# Patient Record
Sex: Female | Born: 1975 | Race: White | Hispanic: No | Marital: Single | Smoking: Never smoker
Health system: Southern US, Community
[De-identification: ages and names within clinical notes are randomized; demographics above are authoritative.]

## PROBLEM LIST (undated history)

## (undated) DIAGNOSIS — O139 Gestational [pregnancy-induced] hypertension without significant proteinuria, unspecified trimester: Secondary | ICD-10-CM

## (undated) DIAGNOSIS — A4902 Methicillin resistant Staphylococcus aureus infection, unspecified site: Secondary | ICD-10-CM

## (undated) DIAGNOSIS — Z9851 Tubal ligation status: Secondary | ICD-10-CM

## (undated) DIAGNOSIS — K8010 Calculus of gallbladder with chronic cholecystitis without obstruction: Secondary | ICD-10-CM

## (undated) DIAGNOSIS — Z20828 Contact with and (suspected) exposure to other viral communicable diseases: Secondary | ICD-10-CM

## (undated) HISTORY — DX: Gestational (pregnancy-induced) hypertension without significant proteinuria, unspecified trimester: O13.9

## (undated) HISTORY — PX: TUBAL LIGATION: SHX77

---

## 2008-05-26 DIAGNOSIS — A4902 Methicillin resistant Staphylococcus aureus infection, unspecified site: Secondary | ICD-10-CM

## 2008-05-26 HISTORY — DX: Methicillin resistant Staphylococcus aureus infection, unspecified site: A49.02

## 2013-05-20 ENCOUNTER — Encounter (HOSPITAL_COMMUNITY): Admission: EM | Disposition: A | Discharge status: Home / Self Care | Payer: Self-pay | Source: Home / Self Care

## 2013-05-20 ENCOUNTER — Observation Stay (HOSPITAL_COMMUNITY): Payer: Medicaid Other

## 2013-05-20 ENCOUNTER — Emergency Department (HOSPITAL_COMMUNITY): Payer: Medicaid Other

## 2013-05-20 ENCOUNTER — Encounter (HOSPITAL_COMMUNITY): Payer: Medicaid Other | Admitting: Anesthesiology

## 2013-05-20 ENCOUNTER — Encounter (HOSPITAL_COMMUNITY): Payer: Self-pay | Admitting: Emergency Medicine

## 2013-05-20 ENCOUNTER — Observation Stay (HOSPITAL_COMMUNITY): Payer: Medicaid Other | Admitting: Anesthesiology

## 2013-05-20 ENCOUNTER — Observation Stay (HOSPITAL_COMMUNITY)
Admission: EM | Admit: 2013-05-20 | Discharge: 2013-05-21 | Disposition: A | Discharge status: Home / Self Care | Payer: Medicaid Other | Attending: General Surgery | Admitting: General Surgery

## 2013-05-20 DIAGNOSIS — K8020 Calculus of gallbladder without cholecystitis without obstruction: Secondary | ICD-10-CM

## 2013-05-20 DIAGNOSIS — Z6841 Body Mass Index (BMI) 40.0 and over, adult: Secondary | ICD-10-CM | POA: Insufficient documentation

## 2013-05-20 DIAGNOSIS — K8010 Calculus of gallbladder with chronic cholecystitis without obstruction: Secondary | ICD-10-CM

## 2013-05-20 HISTORY — DX: Calculus of gallbladder with chronic cholecystitis without obstruction: K80.10

## 2013-05-20 HISTORY — PX: LAPAROSCOPIC CHOLECYSTECTOMY W/ CHOLANGIOGRAPHY: SUR757

## 2013-05-20 HISTORY — PX: CHOLECYSTECTOMY: SHX55

## 2013-05-20 HISTORY — DX: Methicillin resistant Staphylococcus aureus infection, unspecified site: A49.02

## 2013-05-20 HISTORY — DX: Tubal ligation status: Z98.51

## 2013-05-20 HISTORY — DX: Contact with and (suspected) exposure to other viral communicable diseases: Z20.828

## 2013-05-20 LAB — CBC WITH DIFFERENTIAL/PLATELET
Basophils Relative: 0 % (ref 0–1)
Eosinophils Absolute: 0.1 10*3/uL (ref 0.0–0.7)
Eosinophils Relative: 1 % (ref 0–5)
Hemoglobin: 12.9 g/dL (ref 12.0–15.0)
MCH: 29.9 pg (ref 26.0–34.0)
MCHC: 33.9 g/dL (ref 30.0–36.0)
MCV: 88.4 fL (ref 78.0–100.0)
Monocytes Absolute: 0.8 10*3/uL (ref 0.1–1.0)
Monocytes Relative: 7 % (ref 3–12)
Neutrophils Relative %: 66 % (ref 43–77)
RBC: 4.31 MIL/uL (ref 3.87–5.11)
RDW: 13.4 % (ref 11.5–15.5)
WBC: 11.4 10*3/uL — ABNORMAL HIGH (ref 4.0–10.5)

## 2013-05-20 LAB — COMPREHENSIVE METABOLIC PANEL
ALT: 16 U/L (ref 0–35)
Alkaline Phosphatase: 67 U/L (ref 39–117)
BUN: 16 mg/dL (ref 6–23)
CO2: 22 mEq/L (ref 19–32)
Calcium: 9.1 mg/dL (ref 8.4–10.5)
GFR calc Af Amer: 83 mL/min — ABNORMAL LOW (ref >90)
Glucose, Bld: 103 mg/dL — ABNORMAL HIGH (ref 70–99)
Potassium: 3.4 mEq/L — ABNORMAL LOW (ref 3.5–5.1)
Sodium: 136 mEq/L (ref 135–145)
Total Protein: 7.2 g/dL (ref 6.0–8.3)

## 2013-05-20 LAB — URINALYSIS, ROUTINE W REFLEX MICROSCOPIC
Bilirubin Urine: NEGATIVE
Ketones, ur: NEGATIVE mg/dL
Nitrite: NEGATIVE
Protein, ur: NEGATIVE mg/dL
Specific Gravity, Urine: 1.027 (ref 1.005–1.030)
Urobilinogen, UA: 0.2 mg/dL (ref 0.0–1.0)

## 2013-05-20 LAB — URINE MICROSCOPIC-ADD ON

## 2013-05-20 LAB — PREGNANCY, URINE: Preg Test, Ur: NEGATIVE

## 2013-05-20 SURGERY — LAPAROSCOPIC CHOLECYSTECTOMY WITH INTRAOPERATIVE CHOLANGIOGRAM
Anesthesia: General | Site: Abdomen

## 2013-05-20 MED ORDER — DIPHENHYDRAMINE HCL 12.5 MG/5ML PO ELIX: 12.5000 mg | ORAL_SOLUTION | Freq: Four times a day (QID) | ORAL | Status: DC | PRN

## 2013-05-20 MED ORDER — DIPHENHYDRAMINE HCL 50 MG/ML IJ SOLN: 12.5000 mg | Freq: Four times a day (QID) | INTRAMUSCULAR | Status: DC | PRN

## 2013-05-20 MED ORDER — GLYCOPYRROLATE 0.2 MG/ML IJ SOLN
INTRAMUSCULAR | Status: DC | PRN
  Administered 2013-05-20: .8 mg via INTRAVENOUS

## 2013-05-20 MED ORDER — LACTATED RINGERS IV SOLN
INTRAVENOUS | Status: DC | PRN
  Administered 2013-05-20: 11:00:00 via INTRAVENOUS

## 2013-05-20 MED ORDER — PROPOFOL 10 MG/ML IV BOLUS
INTRAVENOUS | Status: AC
  Filled 2013-05-20: qty 20

## 2013-05-20 MED ORDER — FENTANYL CITRATE 0.05 MG/ML IJ SOLN
INTRAMUSCULAR | Status: DC | PRN
  Administered 2013-05-20: 100 ug via INTRAVENOUS
  Administered 2013-05-20 (×3): 50 ug via INTRAVENOUS

## 2013-05-20 MED ORDER — LACTATED RINGERS IV SOLN: INTRAVENOUS | Status: DC

## 2013-05-20 MED ORDER — PROMETHAZINE HCL 25 MG/ML IJ SOLN: 6.2500 mg | INTRAMUSCULAR | Status: DC | PRN

## 2013-05-20 MED ORDER — ONDANSETRON HCL 4 MG/2ML IJ SOLN
4.0000 mg | Freq: Once | INTRAMUSCULAR | Status: AC
  Administered 2013-05-20: 4 mg via INTRAVENOUS
  Filled 2013-05-20: qty 2

## 2013-05-20 MED ORDER — FENTANYL CITRATE 0.05 MG/ML IJ SOLN
25.0000 ug | INTRAMUSCULAR | Status: DC | PRN
  Administered 2013-05-20 (×2): 50 ug via INTRAVENOUS

## 2013-05-20 MED ORDER — MIDAZOLAM HCL 2 MG/2ML IJ SOLN
INTRAMUSCULAR | Status: AC
  Filled 2013-05-20: qty 2

## 2013-05-20 MED ORDER — HYDROMORPHONE HCL PF 1 MG/ML IJ SOLN: 0.5000 mg | INTRAMUSCULAR | Status: DC | PRN

## 2013-05-20 MED ORDER — HYDROMORPHONE HCL PF 2 MG/ML IJ SOLN
2.0000 mg | INTRAMUSCULAR | Status: DC | PRN
  Administered 2013-05-20 (×2): 2 mg via INTRAVENOUS
  Filled 2013-05-20 (×2): qty 1

## 2013-05-20 MED ORDER — SODIUM CHLORIDE 0.9 % IV SOLN
INTRAVENOUS | Status: DC
  Administered 2013-05-20: 05:00:00 via INTRAVENOUS

## 2013-05-20 MED ORDER — KCL IN DEXTROSE-NACL 20-5-0.45 MEQ/L-%-% IV SOLN
INTRAVENOUS | Status: DC
  Administered 2013-05-20: 23:00:00 via INTRAVENOUS
  Filled 2013-05-20 (×4): qty 1000

## 2013-05-20 MED ORDER — IOHEXOL 300 MG/ML  SOLN
INTRAMUSCULAR | Status: DC | PRN
  Administered 2013-05-20: 5 mL via INTRAVENOUS

## 2013-05-20 MED ORDER — ONDANSETRON HCL 4 MG/2ML IJ SOLN
4.0000 mg | Freq: Four times a day (QID) | INTRAMUSCULAR | Status: DC | PRN
Start: 2013-05-20 — End: 2013-05-20

## 2013-05-20 MED ORDER — OXYCODONE-ACETAMINOPHEN 5-325 MG PO TABS
1.0000 | ORAL_TABLET | ORAL | Status: DC | PRN
  Administered 2013-05-21 (×2): 2 via ORAL
  Filled 2013-05-20 (×2): qty 2

## 2013-05-20 MED ORDER — ROCURONIUM BROMIDE 100 MG/10ML IV SOLN
INTRAVENOUS | Status: AC
  Filled 2013-05-20: qty 1

## 2013-05-20 MED ORDER — ACETAMINOPHEN 325 MG PO TABS: 650.0000 mg | ORAL_TABLET | Freq: Four times a day (QID) | ORAL | Status: DC | PRN

## 2013-05-20 MED ORDER — POTASSIUM CHLORIDE IN NACL 20-0.9 MEQ/L-% IV SOLN
INTRAVENOUS | Status: DC
  Administered 2013-05-20: 1000 mL via INTRAVENOUS
  Filled 2013-05-20 (×2): qty 1000

## 2013-05-20 MED ORDER — FENTANYL CITRATE 0.05 MG/ML IJ SOLN
INTRAMUSCULAR | Status: AC
  Filled 2013-05-20: qty 5

## 2013-05-20 MED ORDER — PROPOFOL 10 MG/ML IV BOLUS
INTRAVENOUS | Status: DC | PRN
  Administered 2013-05-20: 180 mg via INTRAVENOUS

## 2013-05-20 MED ORDER — INFLUENZA VAC SPLIT QUAD 0.5 ML IM SUSP: 0.5000 mL | INTRAMUSCULAR | Status: DC

## 2013-05-20 MED ORDER — FENTANYL CITRATE 0.05 MG/ML IJ SOLN
100.0000 ug | Freq: Once | INTRAMUSCULAR | Status: AC
  Administered 2013-05-20: 100 ug via INTRAVENOUS
  Filled 2013-05-20: qty 2

## 2013-05-20 MED ORDER — FENTANYL CITRATE 0.05 MG/ML IJ SOLN
INTRAMUSCULAR | Status: AC
  Filled 2013-05-20: qty 2

## 2013-05-20 MED ORDER — DEXAMETHASONE SODIUM PHOSPHATE 10 MG/ML IJ SOLN
INTRAMUSCULAR | Status: AC
  Filled 2013-05-20: qty 1

## 2013-05-20 MED ORDER — GLYCOPYRROLATE 0.2 MG/ML IJ SOLN
INTRAMUSCULAR | Status: AC
  Filled 2013-05-20: qty 4

## 2013-05-20 MED ORDER — ONDANSETRON HCL 4 MG/2ML IJ SOLN
INTRAMUSCULAR | Status: AC
  Filled 2013-05-20: qty 2

## 2013-05-20 MED ORDER — SODIUM CHLORIDE 0.9 % IV SOLN
3.0000 g | Freq: Once | INTRAVENOUS | Status: DC
  Filled 2013-05-20: qty 3

## 2013-05-20 MED ORDER — MIDAZOLAM HCL 5 MG/5ML IJ SOLN
INTRAMUSCULAR | Status: DC | PRN
  Administered 2013-05-20: 2 mg via INTRAVENOUS

## 2013-05-20 MED ORDER — ACETAMINOPHEN 650 MG RE SUPP: 650.0000 mg | Freq: Four times a day (QID) | RECTAL | Status: DC | PRN

## 2013-05-20 MED ORDER — NEOSTIGMINE METHYLSULFATE 1 MG/ML IJ SOLN
INTRAMUSCULAR | Status: DC | PRN
  Administered 2013-05-20: 4.5 mg via INTRAVENOUS

## 2013-05-20 MED ORDER — MEPERIDINE HCL 50 MG/ML IJ SOLN: 6.2500 mg | INTRAMUSCULAR | Status: DC | PRN

## 2013-05-20 MED ORDER — BUPIVACAINE HCL (PF) 0.25 % IJ SOLN
INTRAMUSCULAR | Status: AC
  Filled 2013-05-20: qty 30

## 2013-05-20 MED ORDER — LACTATED RINGERS IR SOLN
Status: DC | PRN
  Administered 2013-05-20: 1000 mL

## 2013-05-20 MED ORDER — BUPIVACAINE HCL (PF) 0.25 % IJ SOLN
INTRAMUSCULAR | Status: DC | PRN
  Administered 2013-05-20: 29 mL

## 2013-05-20 MED ORDER — LIDOCAINE HCL (CARDIAC) 20 MG/ML IV SOLN
INTRAVENOUS | Status: AC
  Filled 2013-05-20: qty 5

## 2013-05-20 MED ORDER — HEPARIN SODIUM (PORCINE) 5000 UNIT/ML IJ SOLN
5000.0000 [IU] | Freq: Three times a day (TID) | INTRAMUSCULAR | Status: DC
  Filled 2013-05-20 (×4): qty 1

## 2013-05-20 MED ORDER — DEXAMETHASONE SODIUM PHOSPHATE 10 MG/ML IJ SOLN
INTRAMUSCULAR | Status: DC | PRN
  Administered 2013-05-20: 10 mg via INTRAVENOUS

## 2013-05-20 MED ORDER — ONDANSETRON HCL 4 MG/2ML IJ SOLN
INTRAMUSCULAR | Status: DC | PRN
  Administered 2013-05-20: 4 mg via INTRAVENOUS

## 2013-05-20 MED ORDER — LIDOCAINE HCL (CARDIAC) 20 MG/ML IV SOLN
INTRAVENOUS | Status: DC | PRN
  Administered 2013-05-20: 50 mg via INTRAVENOUS

## 2013-05-20 MED ORDER — ROCURONIUM BROMIDE 100 MG/10ML IV SOLN
INTRAVENOUS | Status: DC | PRN
  Administered 2013-05-20: 40 mg via INTRAVENOUS

## 2013-05-20 MED ORDER — AMPICILLIN-SULBACTAM SODIUM 3 (2-1) G IJ SOLR
3.0000 g | Freq: Four times a day (QID) | INTRAMUSCULAR | Status: DC
  Administered 2013-05-20: 3 g via INTRAVENOUS
  Filled 2013-05-20 (×2): qty 3

## 2013-05-20 SURGICAL SUPPLY — 33 items
APPLIER CLIP ROT 10 11.4 M/L (STAPLE) ×2
CANISTER SUCTION 2500CC (MISCELLANEOUS) IMPLANT
CHLORAPREP W/TINT 26ML (MISCELLANEOUS) ×2 IMPLANT
CLIP APPLIE ROT 10 11.4 M/L (STAPLE) ×1 IMPLANT
COVER MAYO STAND STRL (DRAPES) ×2 IMPLANT
DECANTER SPIKE VIAL GLASS SM (MISCELLANEOUS) ×2 IMPLANT
DERMABOND ADVANCED (GAUZE/BANDAGES/DRESSINGS) ×1
DERMABOND ADVANCED .7 DNX12 (GAUZE/BANDAGES/DRESSINGS) ×1 IMPLANT
DRAPE C-ARM 42X120 X-RAY (DRAPES) ×2 IMPLANT
DRAPE LAPAROSCOPIC ABDOMINAL (DRAPES) ×2 IMPLANT
DRAPE UTILITY XL STRL (DRAPES) ×2 IMPLANT
ELECT REM PT RETURN 9FT ADLT (ELECTROSURGICAL) ×2
ELECTRODE REM PT RTRN 9FT ADLT (ELECTROSURGICAL) ×1 IMPLANT
FILTER SMOKE EVAC LAPAROSHD (FILTER) IMPLANT
GLOVE EUDERMIC 7 POWDERFREE (GLOVE) ×2 IMPLANT
GOWN STRL REIN XL XLG (GOWN DISPOSABLE) ×8 IMPLANT
HEMOSTAT SURGICEL 4X8 (HEMOSTASIS) IMPLANT
KIT BASIN OR (CUSTOM PROCEDURE TRAY) ×2 IMPLANT
MANIFOLD NEPTUNE II (INSTRUMENTS) ×2 IMPLANT
POUCH SPECIMEN RETRIEVAL 10MM (ENDOMECHANICALS) ×2 IMPLANT
SCISSORS LAP 5X35 DISP (ENDOMECHANICALS) ×2 IMPLANT
SET CHOLANGIOGRAPH MIX (MISCELLANEOUS) ×2 IMPLANT
SET IRRIG TUBING LAPAROSCOPIC (IRRIGATION / IRRIGATOR) ×2 IMPLANT
SLEEVE XCEL OPT CAN 5 100 (ENDOMECHANICALS) ×2 IMPLANT
SOLUTION ANTI FOG 6CC (MISCELLANEOUS) ×2 IMPLANT
SUT MNCRL AB 4-0 PS2 18 (SUTURE) ×2 IMPLANT
TOWEL OR 17X26 10 PK STRL BLUE (TOWEL DISPOSABLE) ×2 IMPLANT
TOWEL OR NON WOVEN STRL DISP B (DISPOSABLE) ×2 IMPLANT
TRAY LAP CHOLE (CUSTOM PROCEDURE TRAY) ×2 IMPLANT
TROCAR BLADELESS OPT 5 100 (ENDOMECHANICALS) ×2 IMPLANT
TROCAR XCEL BLUNT TIP 100MML (ENDOMECHANICALS) ×2 IMPLANT
TROCAR XCEL NON-BLD 11X100MML (ENDOMECHANICALS) ×2 IMPLANT
TUBING INSUFFLATION 10FT LAP (TUBING) ×2 IMPLANT

## 2013-05-20 NOTE — ED Notes (Signed)
Spoke with Wendy Powers in [place] about hanging Unasyn abx.and she stated to hold it.

## 2013-05-20 NOTE — Anesthesia Postprocedure Evaluation (Addendum)
  Anesthesia Post-op Note  Patient: Wendy Powers  Procedure(s) Performed: Procedure(s) (LRB): LAPAROSCOPIC CHOLECYSTECTOMY WITH INTRAOPERATIVE CHOLANGIOGRAM (N/A)  Patient Location: PACU  Anesthesia Type: General  Level of Consciousness: awake and alert   Airway and Oxygen Therapy: Patient Spontanous Breathing  Post-op Pain: mild  Post-op Assessment: Post-op Vital signs reviewed, Patient's Cardiovascular Status Stable, Respiratory Function Stable, Patent Airway and No signs of Nausea or vomiting  Last Vitals:  Filed Vitals:   05/20/13 1739  BP: 106/61  Pulse: 54  Temp: 36.6 C  Resp: 16    Post-op Vital Signs: stable   Complications: No apparent anesthesia complications

## 2013-05-20 NOTE — Transfer of Care (Signed)
Immediate Anesthesia Transfer of Care Note  Patient: Wendy Powers  Procedure(s) Performed: Procedure(s) (LRB): LAPAROSCOPIC CHOLECYSTECTOMY WITH INTRAOPERATIVE CHOLANGIOGRAM (N/A)  Patient Location: PACU  Anesthesia Type: General  Level of Consciousness: sedated, patient cooperative and responds to stimulation  Airway & Oxygen Therapy: Patient Spontanous Breathing and Patient connected to face mask oxgen  Post-op Assessment: Report given to PACU RN and Post -op Vital signs reviewed and stable  Post vital signs: Reviewed and stable  Complications: No apparent anesthesia complications

## 2013-05-20 NOTE — Progress Notes (Signed)
   CARE MANAGEMENT ED NOTE 05/20/2013  Patient:  Wendy Powers, Wendy Powers   Account Number:  [id number]  Date Initiated:  05/20/2013  Documentation initiated by:  Edd Arbour  Subjective/Objective Assessment:   37 yr old self pay female States she recently moved to [place] no insurance coverage nor pcp Confirms she has been to DSS but "did not qualify"  c/o flank pain with known hx of dx of gallstones per pt but without tx     Subjective/Objective Assessment Detail:   imaging shows 1. Multiple gallstones including a nonmobile stone in the neck. No evidence of acute cholecystitis or gross biliary dilatation.  2. Incidental 2 cm liver lesion. Consider MRI for further  evaluation  plans per surgery is  Dr. Jamey Ripa will plan cholecystectomy later today, when her family arrives to care for her children.     Action/Plan:   CM noted no coverage nor pcp with a [place] address & first [facility] visit no admissions Cm spoke with pt see notes below Encouraged pt to seek CM for further questions   Action/Plan Detail:   Anticipated DC Date:  05/21/2013     Status Recommendation to Physician:   Result of Recommendation:    Other ED Services  Consult Working Plan    DC Planning Services  Other  PCP issues  Outpatient Services - Pt will follow up    Choice offered to / List presented to:            Status of service:  Completed, signed off  ED Comments:   ED Comments Detail:  CM spoke with pt who confirms self pay [facility] resident with no pcp. CM discussed and provided written information for self pay pcps, importance of pcp for f/u care, www.needymeds.org, discounted pharmacies and other [place] resources such as financial assistance, DSS and  health department  Reviewed resources for [employer] self pay pcps like [facility], family medicine at [address], [facility] family practice, general medical clinics, [facility] urgent care plus others, [facility] out patient  pharmacies and housing Pt voiced understanding and appreciation of resources provided

## 2013-05-20 NOTE — ED Notes (Signed)
Crackers, peanut butter and apple juice given to pt's children for breakfast per pt's request. Diapers and baby wipes offered, pt sts she still has some and will let us know if she needs any more.

## 2013-05-20 NOTE — H&P (Signed)
Wendy Powers is an 37 y.o. female.   Chief Complaint: Gallstones with RUQ pain and some nausea. HPI: 37 y/o female with hx of cholelithiasis just moved to area.  She has had pain in the past, like this and it lasted about 1 week.  Last episode was in September.  Last PM she has acute onset of pain RUQ and going to back, some nausea no vomiting.  She came to the ER where WBC is mildly elevated, LFT'S and lipase are normal.  Abdominal US  Shows:  1. Multiple gallstones including a nonmobile stone in the neck. No evidence of acute cholecystitis or gross biliary dilatation. evidence of acute cholecystitis or gross biliary dilatation.  2. Incidental 2 cm liver lesion. Consider MRI for further evaluation.  We are ask to see.  She is feeling better now, but still has tenderness in the RUQ going to back.     Past Medical History  Diagnosis Date   No medical issues Body mass index is 43.41 kg/(m^2).      History reviewed. No pertinent past surgical history. Tubal ligation and 2 C sections History reviewed. No pertinent family history. Both parents living, overweight and smokers 2 brothers one with ETOH use  Social History:  reports that she has never smoked. She does not have any smokeless tobacco history on file. She reports that she drinks alcohol. She reports that she does not use illicit drugs.  Allergies: No Known Allergies Prior to Admission medications   None    Results for orders placed during the hospital encounter of 05/20/13 (from the past 48 hour(s))  COMPREHENSIVE METABOLIC PANEL     Status: Abnormal   Collection Time    05/20/13  4:35 AM      Result Value Range   Sodium 136  135 - 145 mEq/L   Potassium 3.4 (*) 3.5 - 5.1 mEq/L   Chloride 101  96 - 112 mEq/L   CO2 22  19 - 32 mEq/L   Glucose, Bld 103 (*) 70 - 99 mg/dL   BUN 16  6 - 23 mg/dL   Creatinine, Ser 4.09  0.50 - 1.10 mg/dL   Calcium 9.1  8.4 - 81.1 mg/dL   Total Protein 7.2  6.0 - 8.3 g/dL   Albumin 3.7  3.5 -  5.2 g/dL   AST 14  0 - 37 U/L   ALT 16  0 - 35 U/L   Alkaline Phosphatase 67  39 - 117 U/L   Total Bilirubin 0.2 (*) 0.3 - 1.2 mg/dL   GFR calc non Af Amer 71 (*) >90 mL/min   GFR calc Af Amer 83 (*) >90 mL/min   Comment: (NOTE)     The eGFR has been calculated using the CKD EPI equation.     This calculation has not been validated in all clinical situations.     eGFR's persistently <90 mL/min signify possible Chronic Kidney     Disease.  LIPASE, BLOOD     Status: None   Collection Time    05/20/13  4:35 AM      Result Value Range   Lipase 22  11 - 59 U/L  CBC WITH DIFFERENTIAL     Status: Abnormal   Collection Time    05/20/13  4:35 AM      Result Value Range   WBC 11.4 (*) 4.0 - 10.5 K/uL   RBC 4.31  3.87 - 5.11 MIL/uL   Hemoglobin 12.9  12.0 - 15.0 g/dL  HCT 38.1  36.0 - 46.0 %   MCV 88.4  78.0 - 100.0 fL   MCH 29.9  26.0 - 34.0 pg   MCHC 33.9  30.0 - 36.0 g/dL   RDW 16.1  09.6 - 04.5 %   Platelets 237  150 - 400 K/uL   Neutrophils Relative % 66  43 - 77 %   Neutro Abs 7.6  1.7 - 7.7 K/uL   Lymphocytes Relative 25  12 - 46 %   Lymphs Abs 2.9  0.7 - 4.0 K/uL   Monocytes Relative 7  3 - 12 %   Monocytes Absolute 0.8  0.1 - 1.0 K/uL   Eosinophils Relative 1  0 - 5 %   Eosinophils Absolute 0.1  0.0 - 0.7 K/uL   Basophils Relative 0  0 - 1 %   Basophils Absolute 0.0  0.0 - 0.1 K/uL  URINALYSIS, ROUTINE W REFLEX MICROSCOPIC     Status: Abnormal   Collection Time    05/20/13  4:37 AM      Result Value Range   Color, Urine YELLOW  YELLOW   APPearance CLEAR  CLEAR   Specific Gravity, Urine 1.027  1.005 - 1.030   pH 5.5  5.0 - 8.0   Glucose, UA NEGATIVE  NEGATIVE mg/dL   Hgb urine dipstick LARGE (*) NEGATIVE   Bilirubin Urine NEGATIVE  NEGATIVE   Ketones, ur NEGATIVE  NEGATIVE mg/dL   Protein, ur NEGATIVE  NEGATIVE mg/dL   Urobilinogen, UA 0.2  0.0 - 1.0 mg/dL   Nitrite NEGATIVE  NEGATIVE   Leukocytes, UA NEGATIVE  NEGATIVE  PREGNANCY, URINE     Status: None    Collection Time    05/20/13  4:37 AM      Result Value Range   Preg Test, Ur NEGATIVE  NEGATIVE   Comment:            THE SENSITIVITY OF THIS     METHODOLOGY IS >20 mIU/mL.  URINE MICROSCOPIC-ADD ON     Status: None   Collection Time    05/20/13  4:37 AM      Result Value Range   Squamous Epithelial / LPF RARE  RARE   WBC, UA 0-2  <3 WBC/hpf   RBC / HPF 21-50  <3 RBC/hpf   US Abdomen Complete  05/20/2013   CLINICAL DATA:  Right upper quadrant pain.  EXAM: ULTRASOUND ABDOMEN COMPLETE  COMPARISON:  None.  FINDINGS: Gallbladder:  Multiple shadowing stones were identified in the gallbladder, including a 2.8 cm nonmobile stone within the neck. The gallbladder wall was not thickened, measuring 2.9 mm. Sonographic Murphy sign was negative.  Common bile duct:  Diameter: 6.2 mm  Liver:  A 2.0 x 1.1 x 1.4 cm hypoechoic lesion was present in the right hepatic lobe.  IVC:  No abnormality visualized.  Pancreas:  Visualized portion unremarkable.  Spleen:  Size and appearance within normal limits.  Right Kidney:  Length: 11.6. Echogenicity within normal limits. No mass or hydronephrosis visualized.  Left Kidney:  Length: 12.1. Echogenicity within normal limits. No mass or hydronephrosis visualized.  Abdominal aorta:  No aneurysm visualized.  Other findings:  None.  IMPRESSION: 1. Multiple gallstones including a nonmobile stone in the neck. No evidence of acute cholecystitis or gross biliary dilatation. 2. Incidental 2 cm liver lesion. Consider MRI for further evaluation.   Electronically Signed   By: Sebastian Ache   On: 05/20/2013 07:21    Review of Systems  Constitutional:  Negative.   HENT: Negative.   Eyes: Negative.   Respiratory: Negative.   Cardiovascular: Negative.   Gastrointestinal: Negative.   Genitourinary: Negative.   Musculoskeletal: Negative.   Skin: Negative.   Neurological: Negative.   Endo/Heme/Allergies: Negative.   Psychiatric/Behavioral: Negative.     Blood pressure 122/55,  pulse 50, temperature 98.6 F (37 C), temperature source Oral, resp. rate 16, height 5\' 4"  (1.626 m), weight 114.76 kg (253 lb), last menstrual period 04/20/2013, SpO2 100.00%. Physical Exam  Constitutional: She is oriented to person, place, and time. She appears well-developed and well-nourished. No distress.  Body mass index is 43.41 kg/(m^2). 43 BP 122/55  Pulse 50  Temp(Src) 98.6 F (37 C) (Oral)  Resp 16  Ht 5\' 4"  (1.626 m)  Wt 114.76 kg (253 lb)  BMI 43.41 kg/m2  SpO2 100%  LMP 04/20/2013  HENT:  Head: Normocephalic and atraumatic.  Nose: Nose normal.  Eyes: Conjunctivae and EOM are normal. Pupils are equal, round, and reactive to light. Right eye exhibits no discharge. Left eye exhibits no discharge. No scleral icterus.  Neck: Normal range of motion. Neck supple. No JVD present. No tracheal deviation present. No thyromegaly present.  Cardiovascular: Normal rate, regular rhythm, normal heart sounds and intact distal pulses.  Exam reveals no gallop.   No murmur heard. Respiratory: Effort normal and breath sounds normal. No respiratory distress. She has no wheezes. She has no rales. She exhibits no tenderness.  GI: Soft. Bowel sounds are normal. She exhibits no distension and no mass. There is tenderness (pain in RUQ and goes to back.). There is no rebound and no guarding.  Morbid obesity  Musculoskeletal: She exhibits no edema and no tenderness.  Lymphadenopathy:    She has no cervical adenopathy.  Neurological: She is alert and oriented to person, place, and time. No cranial nerve deficit.  Skin: Skin is warm and dry. No rash noted. She is not diaphoretic. No erythema. No pallor.  Psychiatric: She has a normal mood and affect. Her behavior is normal. Judgment and thought content normal.     Assessment/Plan: 1.  Cholelithiasis with possible cholecystitis 2.  Body mass index is 43.41 kg/(m^2).   Plan:  Dr. Jamey Ripa will plan cholecystectomy later today, when her family arrives  to care for her children.  Erasmo Vertz 05/20/2013, 8:35 AM

## 2013-05-20 NOTE — Op Note (Signed)
Wendy Powers Sep 02, 1975 [id number] 05/20/2013  Preoperative diagnosis: Early acute calculus cholecystitis  Postoperative diagnosis: Same  Procedure: Laparoscopic cholecystectomy with intraoperative cholangiogram  Surgeon: Currie Paris, MD, FACS  Assistant surgeon: Dr. Violeta Gelinas   Anesthesia: General  Clinical History and Indications: This patient has known gallstones and comes in today To the emergency room because of acute right upper quadrant pain radiating through to the back. Her gallbladder ultrasound showed what appeared to be early thickening of the gallbladder wall and shows slightly elevated white count and was tender in the right upper quadrant. We recommended at this point that she go ahead with laparoscopic cholecystectomy and she agreed.  Description of procedure: The patient was seen in the preoperative area. I reviewed the plans for the procedure with her as well as the risks and complications. She had no further questions and wished to proceed.  The patient was taken to the operating room. After satisfactory general endotracheal anesthesia had been obtained the abdomen was prepped and draped. A time out was done.  0.25% plain Marcaine was used at all incisions. I made an umbilical incision, identified the fascia and opened that, and entered the peritoneal cavity under direct vision. A 0 Vicryl pursestring suture was placed and the Hasson cannula was introduced under direct vision and secured with the pursestring. The abdomen was inflated to 15 cm.  The camera was placed and there were no gross abnormalities. The patient was then placed in reverse Trendelenburg and tilted to the left. A 10/11 trocar was placed in the epigastrium and two 5 mm trochars placed laterally all under direct vision.  The gallbladder was distended and had some edema and early inflammation. There was a stone in the neck of the gallbladder obstructing it.  I was able to open the  peritoneum and identify the cystic duct and cystic artery. I clipped and divided what appeared to be anterior artery. I then put a clip on the cystic duct at the junction of the cystic duct and gallbladder. I had a nice window behind for a safe view.  An intraoperative cholangiogram was then performed. A Cook catheter was introduced percutaneously and placed in the cystic duct. The cholangiogram showed good filling of the common duct and hepatic radicals and free flow into the duodenum. No abnormalities were noted.  The catheter was removed and 3 clips placed on the stay side of the cystic duct. The duct was then divided.  Additional clips are placed on the cystic artery and it was divided. The gallbladder was then removed from below to above the coagulation current of the cautery. It was then placed in a bag to be retrieved later.  The abdomen was irrigated and a check for hemostasis along the bed of the gallbladder made. Once everything appeared to be dry we were able to move the camera to the epigastric port and removed the gallbladder through the umbilical port.  The abdomen was reinsufflated and a final check for hemostasis made. There is no evidence of bleeding or bile leakage. The lateral ports were removed under direct vision and there was no bleeding. The umbilical site was closed with a pursestring, watching with the camera in the epigastric port. The abdomen was then deflated through the epigastric port and that was removed. Skin was closed with 4-0 Monocryl subcuticular and Dermabond.  The patient tolerated the procedure well. There were no operative complications. EBL was minimal. All counts were correct.  Currie Paris, MD, FACS 05/20/2013 12:56 PM

## 2013-05-20 NOTE — ED Notes (Signed)
Pt arrived to the ED with a complaint of flank pain.  Pt states she has been dx with gallstones but has never had anything done about it.  Pt states the pain wouldn't allow her to sleep and has been progressively getting more sever.  Pt states the pain starts underneath her right shoulder blade and then radiates around to her upper abdomen.

## 2013-05-20 NOTE — H&P (Signed)
Agree with A&P of WJ,PA. I think she has early acute cholecystititis, but may just be severe biliary colic. Either way I think she needs cholecystectomy and she would like to proceed today. I have discussed the indications for laparoscopic cholecystectomy with her and provided educational material. We have discussed the risks of surgery, including general risks such as bleeding, infection, lung and heart issues etc. We have also discussed the potential for injuries to other organs, bile duct leaks, and other unexpected events. We have also talked about the fact that this may need to be converted to open under certain circumstances. We discussed the typical post op recovery and the fact that there is a good likelihood of improvement in symptoms and return to normal activity.  She understands this and wishes to proceed to schedule surgery. I believe all of her questions have been answered.

## 2013-05-20 NOTE — ED Provider Notes (Signed)
CSN: [id number]     Arrival date & time 05/20/13  0341 History   First MD Initiated Contact with Patient 05/20/13 [phone]     Chief Complaint  Patient presents with  . Abdominal Pain   (Consider location/radiation/quality/duration/timing/severity/associated sxs/prior Treatment) HPI This is a 37 year old female with known gallstones. She is here with right upper quadrant abdominal pain that began about midnight. It has been constant. It radiates to the right flank and shoulder. She rates it as severe. It is worse with movement or palpation. It has been associated with nausea but no vomiting. She denies diarrhea. She characterizes the pain as like previous episodes of biliary colic but more severe. She has not had surgical followup due to lack of insurance.  Past Medical History  Diagnosis Date  . Hx of tubal ligation    History reviewed. No pertinent past surgical history. History reviewed. No pertinent family history. History  Substance Use Topics  . Smoking status: Never Smoker   . Smokeless tobacco: Not on file  . Alcohol Use: Yes   OB History   Grav Para Term Preterm Abortions TAB SAB Ect Mult Living                 Review of Systems  All other systems reviewed and are negative.    Allergies  Review of patient's allergies indicates no known allergies.  Home Medications  No current outpatient prescriptions on file. BP 122/55  Pulse 50  Temp(Src) 98.6 F (37 C) (Oral)  Resp 16  Ht 5\' 4"  (1.626 m)  Wt 253 lb (114.76 kg)  BMI 43.41 kg/m2  SpO2 100%  LMP 04/20/2013  Physical Exam General: Well-developed, well-nourished female in no acute distress; appearance consistent with age of record HENT: normocephalic; atraumatic Eyes: pupils equal, round and reactive to light; extraocular muscles intact Neck: supple Heart: regular rate and rhythm; no murmurs, rubs or gallops Lungs: clear to auscultation bilaterally Abdomen: soft; nondistended; right upper quadrant  tenderness; no masses or hepatosplenomegaly; bowel sounds present Extremities: No deformity; full range of motion; pulses normal Neurologic: Awake, alert and oriented; motor function intact in all extremities and symmetric; no facial droop Skin: Warm and dry Psychiatric: Normal mood and affect    ED Course  Procedures (including critical care time)  MDM   Nursing notes and vitals signs, including pulse oximetry, reviewed.  Summary of this visit's results, reviewed by myself:  Labs:  Results for orders placed during the hospital encounter of 05/20/13 (from the past 24 hour(s))  COMPREHENSIVE METABOLIC PANEL     Status: Abnormal   Collection Time    05/20/13  4:35 AM      Result Value Range   Sodium 136  135 - 145 mEq/L   Potassium 3.4 (*) 3.5 - 5.1 mEq/L   Chloride 101  96 - 112 mEq/L   CO2 22  19 - 32 mEq/L   Glucose, Bld 103 (*) 70 - 99 mg/dL   BUN 16  6 - 23 mg/dL   Creatinine, Ser 4.40  0.50 - 1.10 mg/dL   Calcium 9.1  8.4 - 10.2 mg/dL   Total Protein 7.2  6.0 - 8.3 g/dL   Albumin 3.7  3.5 - 5.2 g/dL   AST 14  0 - 37 U/L   ALT 16  0 - 35 U/L   Alkaline Phosphatase 67  39 - 117 U/L   Total Bilirubin 0.2 (*) 0.3 - 1.2 mg/dL   GFR calc non Af Amer 71 (*) >  90 mL/min   GFR calc Af Amer 83 (*) >90 mL/min  LIPASE, BLOOD     Status: None   Collection Time    05/20/13  4:35 AM      Result Value Range   Lipase 22  11 - 59 U/L  CBC WITH DIFFERENTIAL     Status: Abnormal   Collection Time    05/20/13  4:35 AM      Result Value Range   WBC 11.4 (*) 4.0 - 10.5 K/uL   RBC 4.31  3.87 - 5.11 MIL/uL   Hemoglobin 12.9  12.0 - 15.0 g/dL   HCT 40.9  81.1 - 91.4 %   MCV 88.4  78.0 - 100.0 fL   MCH 29.9  26.0 - 34.0 pg   MCHC 33.9  30.0 - 36.0 g/dL   RDW 78.2  95.6 - 21.3 %   Platelets 237  150 - 400 K/uL   Neutrophils Relative % 66  43 - 77 %   Neutro Abs 7.6  1.7 - 7.7 K/uL   Lymphocytes Relative 25  12 - 46 %   Lymphs Abs 2.9  0.7 - 4.0 K/uL   Monocytes Relative 7  3 - 12  %   Monocytes Absolute 0.8  0.1 - 1.0 K/uL   Eosinophils Relative 1  0 - 5 %   Eosinophils Absolute 0.1  0.0 - 0.7 K/uL   Basophils Relative 0  0 - 1 %   Basophils Absolute 0.0  0.0 - 0.1 K/uL  URINALYSIS, ROUTINE W REFLEX MICROSCOPIC     Status: Abnormal   Collection Time    05/20/13  4:37 AM      Result Value Range   Color, Urine YELLOW  YELLOW   APPearance CLEAR  CLEAR   Specific Gravity, Urine 1.027  1.005 - 1.030   pH 5.5  5.0 - 8.0   Glucose, UA NEGATIVE  NEGATIVE mg/dL   Hgb urine dipstick LARGE (*) NEGATIVE   Bilirubin Urine NEGATIVE  NEGATIVE   Ketones, ur NEGATIVE  NEGATIVE mg/dL   Protein, ur NEGATIVE  NEGATIVE mg/dL   Urobilinogen, UA 0.2  0.0 - 1.0 mg/dL   Nitrite NEGATIVE  NEGATIVE   Leukocytes, UA NEGATIVE  NEGATIVE  PREGNANCY, URINE     Status: None   Collection Time    05/20/13  4:37 AM      Result Value Range   Preg Test, Ur NEGATIVE  NEGATIVE  URINE MICROSCOPIC-ADD ON     Status: None   Collection Time    05/20/13  4:37 AM      Result Value Range   Squamous Epithelial / LPF RARE  RARE   WBC, UA 0-2  <3 WBC/hpf   RBC / HPF 21-50  <3 RBC/hpf    Imaging Studies: US Abdomen Complete  05/20/2013   CLINICAL DATA:  Right upper quadrant pain.  EXAM: ULTRASOUND ABDOMEN COMPLETE  COMPARISON:  None.  FINDINGS: Gallbladder:  Multiple shadowing stones were identified in the gallbladder, including a 2.8 cm nonmobile stone within the neck. The gallbladder wall was not thickened, measuring 2.9 mm. Sonographic Murphy sign was negative.  Common bile duct:  Diameter: 6.2 mm  Liver:  A 2.0 x 1.1 x 1.4 cm hypoechoic lesion was present in the right hepatic lobe.  IVC:  No abnormality visualized.  Pancreas:  Visualized portion unremarkable.  Spleen:  Size and appearance within normal limits.  Right Kidney:  Length: 11.6. Echogenicity within normal limits. No mass or  hydronephrosis visualized.  Left Kidney:  Length: 12.1. Echogenicity within normal limits. No mass or hydronephrosis  visualized.  Abdominal aorta:  No aneurysm visualized.  Other findings:  None.  IMPRESSION: 1. Multiple gallstones including a nonmobile stone in the neck. No evidence of acute cholecystitis or gross biliary dilatation. 2. Incidental 2 cm liver lesion. Consider MRI for further evaluation.   Electronically Signed   By: Sebastian Ache   On: 05/20/2013 07:21   7:27 AM Pain has improved. Abdomen soft, significantly less right upper quadrant tenderness.  7:49 AM Discussed with Dr. Jamey Ripa. He or a colleague will see in ED.      Hanley Seamen, MD 05/20/13 [phone]

## 2013-05-20 NOTE — ED Notes (Signed)
Pt has her 2 young children at bedside, ages 2 and 43. Pt has no family in the area, she called her mother to come to town to take care of children while she is in surgery. PA Marlyne Beards is aware and states we will wait for pt to arrange childcare before taking her to OR.

## 2013-05-20 NOTE — ED Notes (Signed)
Spoke to pt regarding childcare, pt reports her mother is coming. Pt sts it may take couple of hours until her mother arrives.

## 2013-05-20 NOTE — Anesthesia Preprocedure Evaluation (Signed)
Anesthesia Evaluation  Patient identified by MRN, date of birth, ID band Patient awake    Reviewed: Allergy & Precautions, H&P , NPO status , Patient's Chart, lab work & pertinent test results  Airway Mallampati: II TM Distance: >3 FB Neck ROM: Full    Dental no notable dental hx.    Pulmonary neg pulmonary ROS,  breath sounds clear to auscultation  Pulmonary exam normal       Cardiovascular negative cardio ROS  Rhythm:Regular Rate:Normal     Neuro/Psych negative neurological ROS  negative psych ROS   GI/Hepatic negative GI ROS, Neg liver ROS,   Endo/Other  negative endocrine ROSMorbid obesity  Renal/GU negative Renal ROS  negative genitourinary   Musculoskeletal negative musculoskeletal ROS (+)   Abdominal   Peds negative pediatric ROS (+)  Hematology negative hematology ROS (+)   Anesthesia Other Findings   Reproductive/Obstetrics negative OB ROS                           Anesthesia Physical Anesthesia Plan  ASA: II  Anesthesia Plan: General   Post-op Pain Management:    Induction: Intravenous  Airway Management Planned: Oral ETT  Additional Equipment:   Intra-op Plan:   Post-operative Plan: Extubation in OR  Informed Consent: I have reviewed the patients History and Physical, chart, labs and discussed the procedure including the risks, benefits and alternatives for the proposed anesthesia with the patient or authorized representative who has indicated his/her understanding and acceptance.   Dental advisory given  Plan Discussed with: CRNA  Anesthesia Plan Comments:         Anesthesia Quick Evaluation

## 2013-05-21 MED ORDER — HYDROCODONE-ACETAMINOPHEN 5-325 MG PO TABS: 1.0000 | ORAL_TABLET | Freq: Four times a day (QID) | ORAL | Status: DC | PRN

## 2013-05-21 NOTE — Care Management Utilization Note (Signed)
05/21/13 1031 Galilea Quito,MSN,RN [id number] UR complete

## 2013-05-21 NOTE — Discharge Summary (Signed)
Physician Discharge Summary  Patient ID:  [name]  MRN: [id number]  DOB/AGE: 1975-09-16 37 y.o.  Admit date: 05/20/2013 Discharge date: 05/21/2013  Discharge Diagnoses:   Principal Problem:   Cholelithiasis with cholecystitis  Operation: Procedure(s): LAPAROSCOPIC CHOLECYSTECTOMY WITH INTRAOPERATIVE CHOLANGIOGRAM on 05/20/2013 - C. Streck  Discharged Condition: good  Hospital Course: Wendy Powers is an 37 y.o. female whose primary care physician is No primary provider on file. and who was admitted 05/20/2013 with a chief complaint of acute cholecystitis. She was brought to the operating room on 05/20/2013 and underwent  LAPAROSCOPIC CHOLECYSTECTOMY WITH INTRAOPERATIVE CHOLANGIOGRAM.  She is doing well and ready for discharge.  The discharge instructions were reviewed with the patient.  Consults: None  Significant Diagnostic Studies: Results for orders placed during the hospital encounter of 05/20/13  COMPREHENSIVE METABOLIC PANEL      Result Value Range   Sodium 136  135 - 145 mEq/L   Potassium 3.4 (*) 3.5 - 5.1 mEq/L   Chloride 101  96 - 112 mEq/L   CO2 22  19 - 32 mEq/L   Glucose, Bld 103 (*) 70 - 99 mg/dL   BUN 16  6 - 23 mg/dL   Creatinine, Ser 0.86  0.50 - 1.10 mg/dL   Calcium 9.1  8.4 - 57.8 mg/dL   Total Protein 7.2  6.0 - 8.3 g/dL   Albumin 3.7  3.5 - 5.2 g/dL   AST 14  0 - 37 U/L   ALT 16  0 - 35 U/L   Alkaline Phosphatase 67  39 - 117 U/L   Total Bilirubin 0.2 (*) 0.3 - 1.2 mg/dL   GFR calc non Af Amer 71 (*) >90 mL/min   GFR calc Af Amer 83 (*) >90 mL/min  LIPASE, BLOOD      Result Value Range   Lipase 22  11 - 59 U/L  CBC WITH DIFFERENTIAL      Result Value Range   WBC 11.4 (*) 4.0 - 10.5 K/uL   RBC 4.31  3.87 - 5.11 MIL/uL   Hemoglobin 12.9  12.0 - 15.0 g/dL   HCT 46.9  62.9 - 52.8 %   MCV 88.4  78.0 - 100.0 fL   MCH 29.9  26.0 - 34.0 pg   MCHC 33.9  30.0 - 36.0 g/dL   RDW 41.3  24.4 - 01.0 %   Platelets 237  150 - 400 K/uL   Neutrophils Relative % 66  43 - 77 %   Neutro Abs 7.6  1.7 - 7.7 K/uL   Lymphocytes Relative 25  12 - 46 %   Lymphs Abs 2.9  0.7 - 4.0 K/uL   Monocytes Relative 7  3 - 12 %   Monocytes Absolute 0.8  0.1 - 1.0 K/uL   Eosinophils Relative 1  0 - 5 %   Eosinophils Absolute 0.1  0.0 - 0.7 K/uL   Basophils Relative 0  0 - 1 %   Basophils Absolute 0.0  0.0 - 0.1 K/uL  URINALYSIS, ROUTINE W REFLEX MICROSCOPIC      Result Value Range   Color, Urine YELLOW  YELLOW   APPearance CLEAR  CLEAR   Specific Gravity, Urine 1.027  1.005 - 1.030   pH 5.5  5.0 - 8.0   Glucose, UA NEGATIVE  NEGATIVE mg/dL   Hgb urine dipstick LARGE (*) NEGATIVE   Bilirubin Urine NEGATIVE  NEGATIVE   Ketones, ur NEGATIVE  NEGATIVE mg/dL   Protein, ur NEGATIVE  NEGATIVE mg/dL  Urobilinogen, UA 0.2  0.0 - 1.0 mg/dL   Nitrite NEGATIVE  NEGATIVE   Leukocytes, UA NEGATIVE  NEGATIVE  PREGNANCY, URINE      Result Value Range   Preg Test, Ur NEGATIVE  NEGATIVE  URINE MICROSCOPIC-ADD ON      Result Value Range   Squamous Epithelial / LPF RARE  RARE   WBC, UA 0-2  <3 WBC/hpf   RBC / HPF 21-50  <3 RBC/hpf    Dg Cholangiogram Operative  05/20/2013   CLINICAL DATA:  Laparoscopic cholecystectomy  EXAM: INTRAOPERATIVE CHOLANGIOGRAM  TECHNIQUE: Cholangiographic images from the C-arm fluoroscopic device were submitted for interpretation post-operatively. Please see the procedural report for the amount of contrast and the fluoroscopy time utilized.  COMPARISON:  05/10/2013  FINDINGS: Intraoperative cholangiogram performed following the laparoscopic cholecystectomy. The cystic duct, biliary confluence, common hepatic duct, and common bile duct appear patent. No definite obstruction, dilatation, filling defect or retained stone. Contrast opacifies the duodenum.  IMPRESSION: Patent biliary system.   Electronically Signed   By: Ruel Favors M.D.   On: 05/20/2013 13:21   US Abdomen Complete  05/20/2013   CLINICAL DATA:  Right upper  quadrant pain.  EXAM: ULTRASOUND ABDOMEN COMPLETE  COMPARISON:  None.  FINDINGS: Gallbladder:  Multiple shadowing stones were identified in the gallbladder, including a 2.8 cm nonmobile stone within the neck. The gallbladder wall was not thickened, measuring 2.9 mm. Sonographic Murphy sign was negative.  Common bile duct:  Diameter: 6.2 mm  Liver:  A 2.0 x 1.1 x 1.4 cm hypoechoic lesion was present in the right hepatic lobe.  IVC:  No abnormality visualized.  Pancreas:  Visualized portion unremarkable.  Spleen:  Size and appearance within normal limits.  Right Kidney:  Length: 11.6. Echogenicity within normal limits. No mass or hydronephrosis visualized.  Left Kidney:  Length: 12.1. Echogenicity within normal limits. No mass or hydronephrosis visualized.  Abdominal aorta:  No aneurysm visualized.  Other findings:  None.  IMPRESSION: 1. Multiple gallstones including a nonmobile stone in the neck. No evidence of acute cholecystitis or gross biliary dilatation. 2. Incidental 2 cm liver lesion. Consider MRI for further evaluation.   Electronically Signed   By: Sebastian Ache   On: 05/20/2013 07:21    Discharge Exam:  Filed Vitals:   05/21/13 0601  BP: 119/45  Pulse: 50  Temp: 98.7 F (37.1 C)  Resp: 19   General: WN obese WF who is alert and generally healthy appearing.  Lungs: Clear to auscultation and symmetric breath sounds. Abdomen: Soft. No mass. No hernia. Normal bowel sounds. Incisions look okay.  Discharge Medications:     Medication List         HYDROcodone-acetaminophen 5-325 MG per tablet  Commonly known as:  NORCO/VICODIN  Take 1-2 tablets by mouth every 6 (six) hours as needed.        Disposition: Final discharge disposition not confirmed      Discharge Orders   Future Orders Complete By Expires   Diet - low sodium heart healthy  As directed    Increase activity slowly  As directed      Activity:  Driving - May drive in 2 or 3 days, if doing well.   Lifting - No lifting  greater than 15 pounds for 1 week, then no limit.  Wound Care:   May shower starting tomorrow.  Diet:  As tolerated.  Follow up appointment:  Call Dr. Tenna Child office [facility]) at [phone] for an appointment  in 2 to 3 weeks.      She knows that Dr. Jamey Ripa is retiring and she will see another partner.  Medications and dosages:  Resume your home medications.  You have a prescription for:  Vicodin   Signed: Ovidio Kin, M.D., [facility] Office:  [phone]  05/21/2013, 9:45 AM

## 2013-05-23 ENCOUNTER — Encounter (HOSPITAL_COMMUNITY): Payer: Self-pay | Admitting: Surgery

## 2013-05-23 NOTE — Progress Notes (Signed)
Discussed case with [facility] staff and provided a referral

## 2013-06-14 ENCOUNTER — Encounter (INDEPENDENT_AMBULATORY_CARE_PROVIDER_SITE_OTHER): Payer: Self-pay

## 2013-09-09 ENCOUNTER — Encounter (HOSPITAL_COMMUNITY): Payer: Self-pay | Admitting: Emergency Medicine

## 2013-09-09 ENCOUNTER — Emergency Department (HOSPITAL_COMMUNITY)
Admission: EM | Admit: 2013-09-09 | Discharge: 2013-09-09 | Disposition: A | Discharge status: Home / Self Care | Payer: Medicaid Other | Source: Home / Self Care | Attending: Family Medicine | Admitting: Family Medicine

## 2013-09-09 DIAGNOSIS — B372 Candidiasis of skin and nail: Secondary | ICD-10-CM

## 2013-09-09 MED ORDER — NYSTATIN 100000 UNIT/GM EX CREA: TOPICAL_CREAM | CUTANEOUS | Status: DC

## 2013-09-09 MED ORDER — FLUCONAZOLE 150 MG PO TABS: 150.0000 mg | ORAL_TABLET | Freq: Every day | ORAL | Status: DC

## 2013-09-09 NOTE — ED Provider Notes (Signed)
Wendy Powers is a 38 y.o. female who presents to Urgent Care today for rash under breasts. Present for one week. It is somewhat itchy. She has tried cornstarch which seemed to make it worse. No fevers or chills nausea vomiting or diarrhea.   Past Medical History  Diagnosis Date  . Hx of tubal ligation   . MRSA (methicillin resistant Staphylococcus aureus) 2010    right leg, right arm pit. left leg  . 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 Cholelithiasis with cholecystitis 05/20/2013    Lap chole and IOC on 05/20/13    History  Substance Use Topics  . Smoking status: Never Smoker   . Smokeless tobacco: Never Used  . Alcohol Use: Yes     Comment: 1 to 2 drinks on weekend   ROS as above Medications: No current facility-administered medications for this encounter.   Current Outpatient Prescriptions  Medication Sig Dispense Refill  . fluconazole (DIFLUCAN) 150 MG tablet Take 1 tablet (150 mg total) by mouth daily.  7 tablet  1  . HYDROcodone-acetaminophen (NORCO/VICODIN) 5-325 MG per tablet Take 1-2 tablets by mouth every 6 (six) hours as needed.  30 tablet  0  . nystatin cream (MYCOSTATIN) Apply to affected area 2 times daily  30 g  1    Exam:  BP 134/78  Pulse 68  Temp(Src) 98.5 F (36.9 C) (Oral)  Resp 18  SpO2 100%  LMP 08/26/2013 Gen: Well NAD Skin: Erythematous macule with satellite lesions underneath both breasts. Nontender.  No results found for this or any previous visit (from the past 24 hour(s)). No results found.  Assessment and Plan: 38 y.o. female with intertriginous candidiasis. Plan to treat with fluconazole and nystatin. F/u PRN  Discussed warning signs or symptoms. Please see discharge instructions. Patient expresses understanding.    Rodolph BongEvan S Kendy Haston, MD 09/09/13 1357

## 2013-09-09 NOTE — ED Notes (Signed)
C/o rash under breast for a week now States area does itch and is red Has used foot spray as tx

## 2013-09-09 NOTE — Discharge Instructions (Signed)
Thank you for coming in today. Take fluconazole daily for 7 days. Use the cream twice daily for one week. Keep the area clean and dry. Use talcum powder not cornstarch.  Return as needed  Cutaneous Candidiasis Cutaneous candidiasis is a condition in which there is an overgrowth of yeast (candida) on the skin. Yeast normally live on the skin, but in small enough numbers not to cause any symptoms. In certain cases, increased growth of the yeast may cause an actual yeast infection. This kind of infection usually occurs in areas of the skin that are constantly warm and moist, such as the armpits or the groin. Yeast is the most common cause of diaper rash in babies and in people who cannot control their bowel movements (incontinence). CAUSES  The fungus that most often causes cutaneous candidiasis is Candida albicans. Conditions that can increase the risk of getting a yeast infection of the skin include:  Obesity.  Pregnancy.  Diabetes.  Taking antibiotic medicine.  Taking birth control pills.  Taking steroid medicines.  Thyroid disease.  An iron or zinc deficiency.  Problems with the immune system. SYMPTOMS   Red, swollen area of the skin.  Bumps on the skin.  Itchiness. DIAGNOSIS  The diagnosis of cutaneous candidiasis is usually based on its appearance. Light scrapings of the skin may also be taken and viewed under a microscope to identify the presence of yeast. TREATMENT  Antifungal creams may be applied to the infected skin. In severe cases, oral medicines may be needed.  HOME CARE INSTRUCTIONS   Keep your skin clean and dry.  Maintain a healthy weight.  If you have diabetes, keep your blood sugar under control. SEEK IMMEDIATE MEDICAL CARE IF:  Your rash continues to spread despite treatment.  You have a fever, chills, or abdominal pain. Document Released: 01/28/2011 Document Revised: 08/04/2011 Document Reviewed: 01/28/2011 [facility] Patient Information 2014  [place], [place].

## 2013-11-17 ENCOUNTER — Encounter: Payer: Self-pay | Admitting: Family Medicine

## 2013-11-17 ENCOUNTER — Ambulatory Visit (INDEPENDENT_AMBULATORY_CARE_PROVIDER_SITE_OTHER): Payer: Medicaid Other | Admitting: Family Medicine

## 2013-11-17 VITALS — BP 124/82 | HR 86 | Temp 98.3°F | Ht 63.5 in | Wt 250.0 lb

## 2013-11-17 DIAGNOSIS — Z7689 Persons encountering health services in other specified circumstances: Secondary | ICD-10-CM

## 2013-11-17 DIAGNOSIS — M79671 Pain in right foot: Secondary | ICD-10-CM

## 2013-11-17 DIAGNOSIS — M79609 Pain in unspecified limb: Secondary | ICD-10-CM

## 2013-11-17 DIAGNOSIS — Z7189 Other specified counseling: Secondary | ICD-10-CM

## 2013-11-17 NOTE — Patient Instructions (Signed)
It was nice to meet you today!  For your foot - try ice, ibuprofen, tylenol. Can also try inserts. Return for visit if it becomes more of an issue  Toenail - I think your nail will fall off on its own. I would leave it alone for now. Return if it becomes a problem.  Schedule a separate health maintenance visit (routine yearly physical). At this visit we will make sure you are up to date on all your important health maintenance items.  Be well, Dr. Pollie MeyerMcIntyre

## 2013-11-22 NOTE — Progress Notes (Signed)
Patient ID: Wendy Powers, female   DOB: 1975/10/18, 38 y.o.   MRN: [id number]   HPI:  Patient presents today for a new patient appointment to establish general primary care, also to discuss right foot pain for the last few days.  Has had pain on the bottom of her right foot for the last couple of days. She thinks it may be related to having worn different shoes at work. Has not tried icing the foot. She has one toenail which she is concerned may fall off. She is wondering if she needs this removed.  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 Completed some college. Only female sexual partners. Employed full-time. Lives with 39-year-old daughter and 39-year-old son. Her daughter has hearing loss, amblyopia, epilepsy. Her son has anemia, but is otherwise healthy. She has a car. She does not exercise regularly. She denies any tobacco, drug use. She does have 1-2 drinks per week. She wonders if she could be at risk for STDs, as she is sexually active with her children's father, but does not use protection. She has a history of a tubal ligation. She feels safe in her relationships, and denies any mood concerns.  Medications: Takes no medications, has no allergies to medicines or foods.  Health Maintenance:  -Last Pap smear was at least 2 years ago. Had history of colposcopy in her early 3120s. -She is unsure if she's had a tetanus shot, she will look into this history  PHYSICAL EXAM: BP 124/82  Pulse 86  Temp(Src) 98.3 F (36.8 C) (Oral)  Ht 5' 3.5" (1.613 m)  Wt 250 lb (113.399 kg)  BMI 43.59 kg/m2  LMP 11/10/2013 Gen: NAD HEENT: NCAT Lungs: NWOB Neuro: grossly nonfocal, speech normal Ext: no injury or deformity present on right foot. Feet are well perfused.  Full strength and range of motion in right foot. Nontender to palpation.   ASSESSMENT/PLAN:  # Health maintenance:  -schedule separate health maintenance appointment to update HM items and screen for STD's.   # Foot pain: No abnormalities on exam. She may have some mild tendinitis, or a ligamental strain. Recommended she try ice, ibuprofen, Tylenol. Also try over-the-counter shoe inserts. Advised that she return if this becomes more of a concern.  FOLLOW UP: F/u at pt's convenience for physical.  Estevan RyderBrittany J. Pollie MeyerMcIntyre, MD [facility]

## 2013-11-24 ENCOUNTER — Encounter: Payer: Self-pay | Admitting: Family Medicine

## 2013-12-20 ENCOUNTER — Encounter: Payer: Self-pay | Admitting: Family Medicine

## 2013-12-20 ENCOUNTER — Ambulatory Visit (INDEPENDENT_AMBULATORY_CARE_PROVIDER_SITE_OTHER): Payer: Medicaid Other | Admitting: Family Medicine

## 2013-12-20 ENCOUNTER — Other Ambulatory Visit (HOSPITAL_COMMUNITY)
Admission: RE | Admit: 2013-12-20 | Discharge: 2013-12-20 | Disposition: A | Discharge status: Home / Self Care | Payer: Medicaid Other | Source: Ambulatory Visit | Attending: Family Medicine | Admitting: Family Medicine

## 2013-12-20 VITALS — BP 116/79 | HR 62 | Temp 98.3°F | Wt 251.0 lb

## 2013-12-20 DIAGNOSIS — Z01419 Encounter for gynecological examination (general) (routine) without abnormal findings: Secondary | ICD-10-CM

## 2013-12-20 DIAGNOSIS — Z124 Encounter for screening for malignant neoplasm of cervix: Secondary | ICD-10-CM

## 2013-12-20 DIAGNOSIS — Z13220 Encounter for screening for lipoid disorders: Secondary | ICD-10-CM | POA: Diagnosis not present

## 2013-12-20 DIAGNOSIS — Z113 Encounter for screening for infections with a predominantly sexual mode of transmission: Secondary | ICD-10-CM | POA: Diagnosis not present

## 2013-12-20 DIAGNOSIS — M79644 Pain in right finger(s): Secondary | ICD-10-CM | POA: Insufficient documentation

## 2013-12-20 DIAGNOSIS — Z1151 Encounter for screening for human papillomavirus (HPV): Secondary | ICD-10-CM | POA: Insufficient documentation

## 2013-12-20 DIAGNOSIS — N760 Acute vaginitis: Secondary | ICD-10-CM | POA: Insufficient documentation

## 2013-12-20 DIAGNOSIS — R21 Rash and other nonspecific skin eruption: Secondary | ICD-10-CM

## 2013-12-20 DIAGNOSIS — M79609 Pain in unspecified limb: Secondary | ICD-10-CM

## 2013-12-20 DIAGNOSIS — A630 Anogenital (venereal) warts: Secondary | ICD-10-CM

## 2013-12-20 NOTE — Progress Notes (Addendum)
Patient ID: Wendy Powers, female   DOB: Mar 16, 1976, 38 y.o.   MRN: [id number]  HPI:  Patient presents today for a well woman exam.   Concerns today: R hand (see below) Periods: monthly regular periods, no problems with them Contraception: tubes tied Pelvic symptoms: no pelvic pain or vaginal discharge Sexual activity: one partner in last year STD Screening: would like STD testing today Pap smear status: over two years ago, colpo in her 20's Exercise: walks at work Diet: tries to eat healthy  R hand pain: has been having pain in her R thumb at the base of the thumb for about a week. A few days ago she fell and caught herself, is not sure exactly of the mechanism of catching herself because it happened so suddenly. Has pain with movement of her thumb, especially when opposing pinky & thumb, also with extension of thumb.  ROS: See HPI  PMFSH:  Cancers in family: maternal grandmother had lung cancer (was a smoker), no breast cancer or ovarian cancer or colon cancer  PHYSICAL EXAM: BP 116/79  Pulse 62  Temp(Src) 98.3 F (36.8 C) (Oral)  Wt 251 lb (113.853 kg)  LMP 11/29/2013  Gen: NAD, pleasant, cooperative HEENT: NCAT, PERRL, no palpable thyromegaly or anterior cervical lymphadenopathy Heart: RRR, no murmurs Lungs: CTAB, NWOB Abdomen: soft, nontender to palpation, no masses or organomegaly Neuro: grossly nonfocal, speech normal GU: hyperpigmentation of much of the medial aspect of the R labia majora. Small genital wart present on perineum.. Vagina is moist with white discharge. Cervix normal in appearance with small nebothian cyst at 4oclock position. No cervical motion tenderness or tenderness on bimanual exam. No adnexal masses.  Ext: tenderness with motion of R thumb at MCP joint. Also tenderness over thenar eminence musculature. No significant anatomic snuffbox tenderness. No pain with movement of wrist in any direction. No pain with flexion of thumb at PIP joint. No swelling,  erythema, or deformities.  ASSESSMENT/PLAN:  Health maintenance:  -STD screening: will check gc/chlamydia/trichomonas, HIV, RPR, acute hepatitis panel today -pap smear: pap & HPV cotest done today -lipid screening: check lipids today -handout given on health maintenance topics  Vulvar rash Hyperpigmented area on inner aspect of R labia majora noted on exam today. Pt states this was present when she had her child two years ago. She does not do self-examinations. Likely benign but would like to biopsy this to rule out VIN or other more serious etiology. Plan: -f/u in a few weeks for biopsy of hyperpigmented area -advised pt to do self exams with a mirror so she can monitor for changes to the area -Precepted with Dr. Randolm IdolFletke who also examined patient and agrees with this plan.   Genital warts Noted on exam today, pt had not previously seen or been bothered. One small wart present. Advised that if it is not bothering her, that we don't necessarily need to do anything about this. Discussed that HPV transmission is common in sexual relationships. We are biopsying a hyperpigmented area on her R labia majora in a few weeks, and I advised pt that if desired, we can remove the wart at that time.  Pain of right thumb Suspect muscle strain of muscles in thenar eminence. Doubt scaphoid pathology as no snuffbox tenderness. Pt to try tylenol, ibuprofen, ice prn. Follow up if not improving. Precepted with Dr. Randolm IdolFletke who also examined patient and agrees with this plan.   FOLLOW UP: F/u in a few weeks for biopsy of vulvar hyperpigmented area.  GrenadaBrittany  J. Ardelia Mems, [place]

## 2013-12-20 NOTE — Patient Instructions (Signed)
Schedule a procedure visit to biopsy the area on your vulva. See handout below for tips on staying healthy. I will call you if your test results are not normal.  Otherwise, I will send you a letter.  If you do not hear from me with in 2 weeks please call our office.     For weight management: Call Dr. Jenne Campus (our nutritionist) to set up an appointment. Her phone number is: [phone].   For your thumb pain: try tylenol, ibuprofen, ice. If it is not better within a week or so please come back to see me, to make sure it is not a fracture.  Be well, Dr. Ardelia Mems    Health Maintenance Adopting a healthy lifestyle and getting preventive care can go a long way to promote health and wellness. Talk with your health care provider about what schedule of regular examinations is right for you. This is a good chance for you to check in with your provider about disease prevention and staying healthy. In between checkups, there are plenty of things you can do on your own. Experts have done a lot of research about which lifestyle changes and preventive measures are most likely to keep you healthy. Ask your health care provider for more information. WEIGHT AND DIET  Eat a healthy diet  Be sure to include plenty of vegetables, fruits, low-fat dairy products, and lean protein.  Do not eat a lot of foods high in solid fats, added sugars, or salt.  Get regular exercise. This is one of the most important things you can do for your health.  Most adults should exercise for at least 150 minutes each week. The exercise should increase your heart rate and make you sweat (moderate-intensity exercise).  Most adults should also do strengthening exercises at least twice a week. This is in addition to the moderate-intensity exercise.  Maintain a healthy weight  Body mass index (BMI) is a measurement that can be used to identify possible weight problems. It estimates body fat based on height and weight. Your health care  provider can help determine your BMI and help you achieve or maintain a healthy weight.  For females 67 years of age and older:   A BMI below 18.5 is considered underweight.  A BMI of 18.5 to 24.9 is normal.  A BMI of 25 to 29.9 is considered overweight.  A BMI of 30 and above is considered obese.  Watch levels of cholesterol and blood lipids  You should start having your blood tested for lipids and cholesterol at 39 years of age, then have this test every 5 years.  You may need to have your cholesterol levels checked more often if:  Your lipid or cholesterol levels are high.  You are older than 39 years of age.  You are at high risk for heart disease.  CANCER SCREENING   Lung Cancer  Lung cancer screening is recommended for adults 83-23 years old who are at high risk for lung cancer because of a history of smoking.  A yearly low-dose CT scan of the lungs is recommended for people who:  Currently smoke.  Have quit within the past 15 years.  Have at least a 30-pack-year history of smoking. A pack year is smoking an average of one pack of cigarettes a day for 1 year.  Yearly screening should continue until it has been 15 years since you quit.  Yearly screening should stop if you develop a health problem that would prevent you from  having lung cancer treatment.  Breast Cancer  Practice breast self-awareness. This means understanding how your breasts normally appear and feel.  It also means doing regular breast self-exams. Let your health care provider know about any changes, no matter how small.  If you are in your 20s or 30s, you should have a clinical breast exam (CBE) by a health care provider every 1-3 years as part of a regular health exam.  If you are 65 or older, have a CBE every year. Also consider having a breast X-ray (mammogram) every year.  If you have a family history of breast cancer, talk to your health care provider about genetic screening.  If you  are at high risk for breast cancer, talk to your health care provider about having an MRI and a mammogram every year.  Breast cancer gene (BRCA) assessment is recommended for women who have family members with BRCA-related cancers. BRCA-related cancers include:  Breast.  Ovarian.  Tubal.  Peritoneal cancers.  Results of the assessment will determine the need for genetic counseling and BRCA1 and BRCA2 testing. Cervical Cancer Routine pelvic examinations to screen for cervical cancer are no longer recommended for nonpregnant women who are considered low risk for cancer of the pelvic organs (ovaries, uterus, and vagina) and who do not have symptoms. A pelvic examination may be necessary if you have symptoms including those associated with pelvic infections. Ask your health care provider if a screening pelvic exam is right for you.   The Pap test is the screening test for cervical cancer for women who are considered at risk.  If you had a hysterectomy for a problem that was not cancer or a condition that could lead to cancer, then you no longer need Pap tests.  If you are older than 65 years, and you have had normal Pap tests for the past 10 years, you no longer need to have Pap tests.  If you have had past treatment for cervical cancer or a condition that could lead to cancer, you need Pap tests and screening for cancer for at least 20 years after your treatment.  If you no longer get a Pap test, assess your risk factors if they change (such as having a new sexual partner). This can affect whether you should start being screened again.  Some women have medical problems that increase their chance of getting cervical cancer. If this is the case for you, your health care provider may recommend more frequent screening and Pap tests.  The human papillomavirus (HPV) test is another test that may be used for cervical cancer screening. The HPV test looks for the virus that can cause cell changes in  the cervix. The cells collected during the Pap test can be tested for HPV.  The HPV test can be used to screen women 44 years of age and older. Getting tested for HPV can extend the interval between normal Pap tests from three to five years.  An HPV test also should be used to screen women of any age who have unclear Pap test results.  After 39 years of age, women should have HPV testing as often as Pap tests.  Colorectal Cancer  This type of cancer can be detected and often prevented.  Routine colorectal cancer screening usually begins at 39 years of age and continues through 39 years of age.  Your health care provider may recommend screening at an earlier age if you have risk factors for colon cancer.  Your health care  provider may also recommend using home test kits to check for hidden blood in the stool.  A small camera at the end of a tube can be used to examine your colon directly (sigmoidoscopy or colonoscopy). This is done to check for the earliest forms of colorectal cancer.  Routine screening usually begins at age 51.  Direct examination of the colon should be repeated every 5-10 years through 39 years of age. However, you may need to be screened more often if early forms of precancerous polyps or small growths are found. Skin Cancer  Check your skin from head to toe regularly.  Tell your health care provider about any new moles or changes in moles, especially if there is a change in a mole's shape or color.  Also tell your health care provider if you have a mole that is larger than the size of a pencil eraser.  Always use sunscreen. Apply sunscreen liberally and repeatedly throughout the day.  Protect yourself by wearing long sleeves, pants, a wide-brimmed hat, and sunglasses whenever you are outside. HEART DISEASE, DIABETES, AND HIGH BLOOD PRESSURE   Have your blood pressure checked at least every 1-2 years. High blood pressure causes heart disease and increases the  risk of stroke.  If you are between 41 years and 25 years old, ask your health care provider if you should take aspirin to prevent strokes.  Have regular diabetes screenings. This involves taking a blood sample to check your fasting blood sugar level.  If you are at a normal weight and have a low risk for diabetes, have this test once every three years after 39 years of age.  If you are overweight and have a high risk for diabetes, consider being tested at a younger age or more often. PREVENTING INFECTION  Hepatitis B  If you have a higher risk for hepatitis B, you should be screened for this virus. You are considered at high risk for hepatitis B if:  You were born in a country where hepatitis B is common. Ask your health care provider which countries are considered high risk.  Your parents were born in a high-risk country, and you have not been immunized against hepatitis B (hepatitis B vaccine).  You have HIV or AIDS.  You use needles to inject street drugs.  You live with someone who has hepatitis B.  You have had sex with someone who has hepatitis B.  You get hemodialysis treatment.  You take certain medicines for conditions, including cancer, organ transplantation, and autoimmune conditions. Hepatitis C  Blood testing is recommended for:  Everyone born from [birth year] through 1966.  Anyone with known risk factors for hepatitis C. Sexually transmitted infections (STIs)  You should be screened for sexually transmitted infections (STIs) including gonorrhea and chlamydia if:  You are sexually active and are younger than 39 years of age.  You are older than 39 years of age and your health care provider tells you that you are at risk for this type of infection.  Your sexual activity has changed since you were last screened and you are at an increased risk for chlamydia or gonorrhea. Ask your health care provider if you are at risk.  If you do not have HIV, but are at risk, it  may be recommended that you take a prescription medicine daily to prevent HIV infection. This is called pre-exposure prophylaxis (PrEP). You are considered at risk if:  You are sexually active and do not regularly use condoms or know  the HIV status of your partner(s).  You take drugs by injection.  You are sexually active with a partner who has HIV. Talk with your health care provider about whether you are at high risk of being infected with HIV. If you choose to begin PrEP, you should first be tested for HIV. You should then be tested every 3 months for as long as you are taking PrEP.  PREGNANCY   If you are premenopausal and you may become pregnant, ask your health care provider about preconception counseling.  If you may become pregnant, take 400 to 800 micrograms (mcg) of folic acid every day.  If you want to prevent pregnancy, talk to your health care provider about birth control (contraception). OSTEOPOROSIS AND MENOPAUSE   Osteoporosis is a disease in which the bones lose minerals and strength with aging. This can result in serious bone fractures. Your risk for osteoporosis can be identified using a bone density scan.  If you are 61 years of age or older, or if you are at risk for osteoporosis and fractures, ask your health care provider if you should be screened.  Ask your health care provider whether you should take a calcium or vitamin D supplement to lower your risk for osteoporosis.  Menopause may have certain physical symptoms and risks.  Hormone replacement therapy may reduce some of these symptoms and risks. Talk to your health care provider about whether hormone replacement therapy is right for you.  HOME CARE INSTRUCTIONS   Schedule regular health, dental, and eye exams.  Stay current with your immunizations.   Do not use any tobacco products including cigarettes, chewing tobacco, or electronic cigarettes.  If you are pregnant, do not drink alcohol.  If you are  breastfeeding, limit how much and how often you drink alcohol.  Limit alcohol intake to no more than 1 drink per day for nonpregnant women. One drink equals 12 ounces of beer, 5 ounces of wine, or 1 ounces of hard liquor.  Do not use street drugs.  Do not share needles.  Ask your health care provider for help if you need support or information about quitting drugs.  Tell your health care provider if you often feel depressed.  Tell your health care provider if you have ever been abused or do not feel safe at home. Document Released: 11/25/2010 Document Revised: 09/26/2013 Document Reviewed: 04/13/2013 [facility] Patient Information 2015 [place], [place]. This information is not intended to replace advice given to you by your health care provider. Make sure you discuss any questions you have with your health care provider.

## 2013-12-20 NOTE — Assessment & Plan Note (Signed)
Noted on exam today, pt had not previously seen or been bothered. One small wart present. Advised that if it is not bothering her, that we don't necessarily need to do anything about this. Discussed that HPV transmission is common in sexual relationships. We are biopsying a hyperpigmented area on her R labia majora in a few weeks, and I advised pt that if desired, we can remove the wart at that time.

## 2013-12-20 NOTE — Assessment & Plan Note (Addendum)
Hyperpigmented area on inner aspect of R labia majora noted on exam today. Pt states this was present when she had her child two years ago. She does not do self-examinations. Likely benign but would like to biopsy this to rule out VIN or other more serious etiology. Plan: -f/u in a few weeks for biopsy of hyperpigmented area -advised pt to do self exams with a mirror so she can monitor for changes to the area -Precepted with Dr. Randolm IdolFletke who also examined patient and agrees with this plan.

## 2013-12-20 NOTE — Assessment & Plan Note (Signed)
Pt interested in nutrition therapy. Given info for Dr. Gerilyn PilgrimSykes to set up appt for nutritional counseling.

## 2013-12-20 NOTE — Assessment & Plan Note (Signed)
Suspect muscle strain of muscles in thenar eminence. Doubt scaphoid pathology as no snuffbox tenderness. Pt to try tylenol, ibuprofen, ice prn. Follow up if not improving. Precepted with Dr. Randolm IdolFletke who also examined patient and agrees with this plan.

## 2013-12-21 LAB — COMPREHENSIVE METABOLIC PANEL
ALT: 16 U/L (ref 0–35)
AST: 16 U/L (ref 0–37)
Albumin: 4.1 g/dL (ref 3.5–5.2)
Alkaline Phosphatase: 58 U/L (ref 39–117)
BILIRUBIN TOTAL: 0.3 mg/dL (ref 0.2–1.2)
BUN: 16 mg/dL (ref 6–23)
CO2: 23 mEq/L (ref 19–32)
CREATININE: 1.03 mg/dL (ref 0.50–1.10)
Calcium: 8.8 mg/dL (ref 8.4–10.5)
Chloride: 104 mEq/L (ref 96–112)
Glucose, Bld: 88 mg/dL (ref 70–99)
Potassium: 3.8 mEq/L (ref 3.5–5.3)
Sodium: 139 mEq/L (ref 135–145)
Total Protein: 6.8 g/dL (ref 6.0–8.3)

## 2013-12-21 LAB — LIPID PANEL
CHOL/HDL RATIO: 3.9 ratio
CHOLESTEROL: 154 mg/dL (ref 0–200)
HDL: 39 mg/dL — AB (ref >39)
LDL CALC: 99 mg/dL (ref 0–99)
TRIGLYCERIDES: 82 mg/dL (ref <150)
VLDL: 16 mg/dL (ref 0–40)

## 2013-12-21 LAB — HEPATITIS PANEL, ACUTE
HCV Ab: NEGATIVE
Hep A IgM: NONREACTIVE
Hep B C IgM: NONREACTIVE
Hepatitis B Surface Ag: NEGATIVE

## 2013-12-21 LAB — RPR

## 2013-12-21 LAB — HIV ANTIBODY (ROUTINE TESTING W REFLEX): HIV: NONREACTIVE

## 2013-12-23 LAB — CYTOLOGY - PAP

## 2013-12-27 ENCOUNTER — Telehealth: Payer: Self-pay | Admitting: *Deleted

## 2013-12-27 NOTE — Telephone Encounter (Signed)
Message copied by Tanna SavoyPROPOSITO, Tesha Archambeau S on Tue Dec 27, 2013  9:59 AM ------      Message from: Tommie SamsOOK, JAYCE G      Created: Sat Dec 24, 2013  4:42 PM       Please notify of negative pap.            Thanks            Jayce ------

## 2013-12-27 NOTE — Telephone Encounter (Signed)
Patient informed. Wendy Powers S  

## 2013-12-30 ENCOUNTER — Telehealth: Payer: Self-pay | Admitting: Family Medicine

## 2013-12-30 NOTE — Telephone Encounter (Signed)
Spoke with patient and informed her of below 

## 2013-12-30 NOTE — Telephone Encounter (Signed)
Please call patient to discuss full results of physcial and labs

## 2013-12-30 NOTE — Telephone Encounter (Signed)
Labs were unremarkable. Negative pap smear.

## 2014-01-12 ENCOUNTER — Ambulatory Visit (INDEPENDENT_AMBULATORY_CARE_PROVIDER_SITE_OTHER): Payer: Medicaid Other | Admitting: Family Medicine

## 2014-01-12 VITALS — BP 126/70 | HR 65 | Temp 99.1°F

## 2014-01-12 DIAGNOSIS — N9089 Other specified noninflammatory disorders of vulva and perineum: Secondary | ICD-10-CM

## 2014-01-12 NOTE — Progress Notes (Signed)
Patient ID: Wendy Powers, female   DOB: 1976/02/17, 38 y.o.   MRN: [id number] Patient ID: Wendy Powers, female   DOB: 1976/02/17, 38 y.o.   MRN: [id number]  Chief Complaint  Patient presents with  . Biopsy    HPI Wendy Buttonngela Armetta is a 38 y.o. female.   HPI Hyperpigmented (Vulva/ right labia) lesion first noticed 2 years ago when giving birth to son. Patient states may have had longer, but never noticed. Noted again in clinic last month, and recommended to have biopsy to rule out malignancy.  Indication: Hyperpigmented (macular) lesion of the vulva Symptoms:   none  Location:  Right sided labia majora from approximately the 9 o clock position, extending into the perineum.   Past Medical History  Diagnosis Date  . Hx of tubal ligation   . MRSA (methicillin resistant Staphylococcus aureus) 2010    right leg, right arm pit. left leg  . 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 Cholelithiasis with cholecystitis 05/20/2013    Lap chole and IOC on 05/20/13   . Pregnancy induced hypertension     Past Surgical History  Procedure Laterality Date  . Cesarean section      x 2  . Laparoscopic cholecystectomy w/ cholangiography  05/20/13    for acute cholecystitis  . Tubal ligation    . Cholecystectomy N/A 05/20/2013    Procedure: LAPAROSCOPIC CHOLECYSTECTOMY WITH INTRAOPERATIVE CHOLANGIOGRAM;  Surgeon: Currie Parishristian J Streck, MD;  Location: [facility];  Service: General;  Laterality: N/A;    Family History  Problem Relation Age of Onset  . Asthma Brother   . Stroke Maternal Grandmother   . Cancer Maternal Grandmother     lung cancer  . Heart disease Paternal Grandmother     died of heart attack  . Heart disease Paternal Grandfather     died of heart attack  . Hypotension Other     Social History History  Substance Use Topics  . Smoking status: Never Smoker   . Smokeless tobacco: Never Used  . Alcohol Use: Yes     Comment: 1 to 2 drinks on weekend    No  Known Allergies  No current outpatient prescriptions on file.   No current facility-administered medications for this visit.    Review of Systems Review of Systems  Constitutional: Negative for fever and chills.  HENT: Negative.   Respiratory: Negative for shortness of breath.   Cardiovascular: Negative for chest pain.  Gastrointestinal: Negative for nausea and vomiting.  Genitourinary: Negative for dysuria.    Blood pressure 126/70, pulse 65, temperature 99.1 F (37.3 C), temperature source Oral, last menstrual period 11/29/2013.  Physical Exam Physical Exam  Constitutional: She is oriented to person, place, and time. She appears well-developed and well-nourished.  HENT:  Head: Normocephalic and atraumatic.  Neck: Normal range of motion.  Cardiovascular: Normal rate, regular rhythm and normal heart sounds.   Pulmonary/Chest: Effort normal and breath sounds normal. She has no wheezes.  Abdominal: Soft. She exhibits no distension. There is no tenderness.  Genitourinary:  Hyperpigmented irregular patch extending from 9 oclock position on right into perineum approximately 1 to 1.5 cm in width. Small genital wart also noted on perineum.   Neurological: She is alert and oriented to person, place, and time.  Skin: Skin is warm and dry.    Assessment    Prepped with Betadine Local anesthesia with 2% Lidocaine without epinephrone 3 mm punch biopsy performed per protocol Silver  Nitrate applied:  Yes Well tolerated  Specimen appropriately identified and sent to pathology    Plan    Follow-up:  1-2 weeks      [name] 01/12/2014, 9:57 AM   [facility] ATTENDING  NOTE Nicolette Bang I  have seen and examined this patient, reviewed their chart. I have discussed this patient with the resident. I agree with the resident's findings, assessment and care plan. My addendum added in different font color.  NB: Lesion appears benign, biopsy taken to r/o malignancy. Patient  will be contacted with test result.

## 2014-01-12 NOTE — Patient Instructions (Addendum)
You had a biopsy of you vuvla today. The results should be available by early next week.  With any procedure, there is a chance for infection. We minimized that chance today by sterilizing the area and using sterile technique. Signs to watch out for include fevers or chills, foul smelling vaginal discharge, or redness that is spreading to surrounding skin. Please call the clinic if you experience any of these symptoms.  Please return to clinic in 1-2 weeks for follow up and wound check.  Wound Care Wound care helps prevent pain and infection.   HOME CARE   Only take medicine as told by your doctor.  Clean the wound daily with mild soap and water.  Change any bandages (dressings) as told by your doctor.  Put medicated cream and a bandage on the wound as told by your doctor.  Change the bandage if it gets wet, dirty, or starts to smell.  Take showers. Do not take baths, swim, or do anything that puts your wound under water.  Rest and raise (elevate) the wound until the pain and puffiness (swelling) are better.  Keep all doctor visits as told. GET HELP RIGHT AWAY IF:   Yellowish-white fluid (pus) comes from the wound.  Medicine does not lessen your pain.  There is a red streak going away from the wound.  You have a fever. MAKE SURE YOU:   Understand these instructions.  Will watch your condition.  Will get help right away if you are not doing well or get worse. Document Released: 02/19/2008 Document Revised: 08/04/2011 Document Reviewed: 09/15/2010 [facility] Patient Information 2015 [employer] [place]. This information is not intended to replace advice given to you by your health care provider. Make sure you discuss any questions you have with your health care provider.

## 2014-01-16 ENCOUNTER — Encounter: Payer: Self-pay | Admitting: Family Medicine

## 2014-01-16 ENCOUNTER — Telehealth: Payer: Self-pay | Admitting: Family Medicine

## 2014-01-16 NOTE — Telephone Encounter (Signed)
Called and informed patient that both the biopsy and pap were both normal. She has an upcoming appointment and was told that she can discuss any further concerns she may have about the biopsy with her doctor.Busick, Rodena Medin

## 2014-01-16 NOTE — Telephone Encounter (Signed)
Pt was calling back because she had a pap smear done and a biopsy done and wanted to know if the message that the test were normal were for the pap smear or the biopsy or both. Please call back and let her know. jw

## 2014-01-16 NOTE — Telephone Encounter (Signed)
Message left test result are normal. Call if any concern.

## 2014-01-25 ENCOUNTER — Ambulatory Visit: Payer: Medicaid Other | Admitting: Family Medicine

## 2014-05-24 ENCOUNTER — Encounter: Payer: Self-pay | Admitting: Family Medicine

## 2014-05-24 ENCOUNTER — Ambulatory Visit (INDEPENDENT_AMBULATORY_CARE_PROVIDER_SITE_OTHER): Payer: Medicaid Other | Admitting: Family Medicine

## 2014-05-24 VITALS — BP 138/82 | HR 71 | Temp 98.5°F | Ht 63.5 in | Wt 271.2 lb

## 2014-05-24 DIAGNOSIS — M25562 Pain in left knee: Secondary | ICD-10-CM

## 2014-05-24 DIAGNOSIS — R635 Abnormal weight gain: Secondary | ICD-10-CM

## 2014-05-24 DIAGNOSIS — M25561 Pain in right knee: Secondary | ICD-10-CM

## 2014-05-24 MED ORDER — MELOXICAM 7.5 MG PO TABS: 7.5000 mg | ORAL_TABLET | Freq: Every day | ORAL | Status: DC | PRN

## 2014-05-24 NOTE — Progress Notes (Signed)
Patient ID: Wendy Powers, female   DOB: [birth year], 38 y.o.   MRN: [id number]  HPI:  Knee pain: Has bilat knee pain but R worse than L. Also has some pain in R heel. Has gained 20lb since I last saw her. Not hurting every day but when hurts lasts for about 1 day. At its worst hurts about 8-9/10. No swelling around knees, no redness or warmth. No injuries. No hx of this in the past. Began noticing pain this summer, worse over the last few months. Sometimes pain radiates in muscle up to R thigh. Notices cracking/popping of knees. Never had xrays of knees. Has not tried any medicines other than Aleve - doesn't think this helped much. No fevers.  ROS: See HPI.  PMFSH: hx morbid obesity  PHYSICAL EXAM: BP 138/82 mmHg  Pulse 71  Temp(Src) 98.5 F (36.9 C) (Oral)  Ht 5' 3.5" (1.613 m)  Wt 271 lb 3.2 oz (123.016 kg)  BMI 47.28 kg/m2  LMP 05/18/2014 Gen: NAD Neuro: grossly nonfocal speech normal Ext: bilat knees without edema, effusion, erythema, or warmth. Normal patellar mobility. No tenderness along either joint line. lachmans neg bilaterally. mcmurray neg bilat. Full strength with knee flexion & extesnion bilat. LCL & MCL intact.  ASSESSMENT/PLAN:  Bilateral knee pain Etiology not clear. Unlikely osteoarthritis at age 38. Possibly overuse. Most likely due to weight gain on top of already having morbid obesity. -mobic 7.5mg  daily as needed for pain (not a long term medication) -ice, elevate knees -exercise as able to help with weight management.  -For weight management: Call Dr. Gerilyn PilgrimSykes (our nutritionist) to set up an appointment. -bilat standing xrays & complete films of each knee -checking thyroid function to eval for weight gain etiology   FOLLOW UP: F/u in 6 weeks for knee pain  GrenadaBrittany J. Pollie MeyerMcIntyre, MD [facility]

## 2014-05-24 NOTE — Patient Instructions (Addendum)
Knee pain: -mobic 7.5mg  daily as needed for pain (not a long term medication) -ice, elevate knees -exercise as able to help with weight management. Swimming is good exercise! -For weight management: Call Dr. Gerilyn PilgrimSykes (our nutritionist) to set up an appointment. Her phone number is: [phone].  -go get xrays at your convenience -checking thyroid function to be sure it's not causing you to gain weight  Follow up with me in about 6 weeks to see how you're doing.  Be well, Dr. Pollie MeyerMcIntyre    Knee Pain The knee is the complex joint between your thigh and your lower leg. It is made up of bones, tendons, ligaments, and cartilage. The bones that make up the knee are:  The femur in the thigh.  The tibia and fibula in the lower leg.  The patella or kneecap riding in the groove on the lower femur. CAUSES  Knee pain is a common complaint with many causes. A few of these causes are:  Injury, such as:  A ruptured ligament or tendon injury.  Torn cartilage.  Medical conditions, such as:  Gout  Arthritis  Infections  Overuse, over training, or overdoing a physical activity. Knee pain can be minor or severe. Knee pain can accompany debilitating injury. Minor knee problems often respond well to self-care measures or get well on their own. More serious injuries may need medical intervention or even surgery. SYMPTOMS The knee is complex. Symptoms of knee problems can vary widely. Some of the problems are:  Pain with movement and weight bearing.  Swelling and tenderness.  Buckling of the knee.  Inability to straighten or extend your knee.  Your knee locks and you cannot straighten it.  Warmth and redness with pain and fever.  Deformity or dislocation of the kneecap. DIAGNOSIS  Determining what is wrong may be very straight forward such as when there is an injury. It can also be challenging because of the complexity of the knee. Tests to make a diagnosis may include:  Your caregiver  taking a history and doing a physical exam.  Routine X-rays can be used to rule out other problems. X-rays will not reveal a cartilage tear. Some injuries of the knee can be diagnosed by:  Arthroscopy a surgical technique by which a small video camera is inserted through tiny incisions on the sides of the knee. This procedure is used to examine and repair internal knee joint problems. Tiny instruments can be used during arthroscopy to repair the torn knee cartilage (meniscus).  Arthrography is a radiology technique. A contrast liquid is directly injected into the knee joint. Internal structures of the knee joint then become visible on X-ray film.  An MRI scan is a non X-ray radiology procedure in which magnetic fields and a computer produce two- or three-dimensional images of the inside of the knee. Cartilage tears are often visible using an MRI scanner. MRI scans have largely replaced arthrography in diagnosing cartilage tears of the knee.  Blood work.  Examination of the fluid that helps to lubricate the knee joint (synovial fluid). This is done by taking a sample out using a needle and a syringe. TREATMENT The treatment of knee problems depends on the cause. Some of these treatments are:  Depending on the injury, proper casting, splinting, surgery, or physical therapy care will be needed.  Give yourself adequate recovery time. Do not overuse your joints. If you begin to get sore during workout routines, back off. Slow down or do fewer repetitions.  For repetitive activities  such as cycling or running, maintain your strength and nutrition.  Alternate muscle groups. For example, if you are a weight lifter, work the upper body on one day and the lower body the next.  Either tight or weak muscles do not give the proper support for your knee. Tight or weak muscles do not absorb the stress placed on the knee joint. Keep the muscles surrounding the knee strong.  Take care of mechanical  problems.  If you have flat feet, orthotics or special shoes may help. See your caregiver if you need help.  Arch supports, sometimes with wedges on the inner or outer aspect of the heel, can help. These can shift pressure away from the side of the knee most bothered by osteoarthritis.  A brace called an "unloader" brace also may be used to help ease the pressure on the most arthritic side of the knee.  If your caregiver has prescribed crutches, braces, wraps or ice, use as directed. The acronym for this is PRICE. This means protection, rest, ice, compression, and elevation.  Nonsteroidal anti-inflammatory drugs (NSAIDs), can help relieve pain. But if taken immediately after an injury, they may actually increase swelling. Take NSAIDs with food in your stomach. Stop them if you develop stomach problems. Do not take these if you have a history of ulcers, stomach pain, or bleeding from the bowel. Do not take without your caregiver's approval if you have problems with fluid retention, heart failure, or kidney problems.  For ongoing knee problems, physical therapy may be helpful.  Glucosamine and chondroitin are over-the-counter dietary supplements. Both may help relieve the pain of osteoarthritis in the knee. These medicines are different from the usual anti-inflammatory drugs. Glucosamine may decrease the rate of cartilage destruction.  Injections of a corticosteroid drug into your knee joint may help reduce the symptoms of an arthritis flare-up. They may provide pain relief that lasts a few months. You may have to wait a few months between injections. The injections do have a small increased risk of infection, water retention, and elevated blood sugar levels.  Hyaluronic acid injected into damaged joints may ease pain and provide lubrication. These injections may work by reducing inflammation. A series of shots may give relief for as long as 6 months.  Topical painkillers. Applying certain  ointments to your skin may help relieve the pain and stiffness of osteoarthritis. Ask your pharmacist for suggestions. Many over the-counter products are approved for temporary relief of arthritis pain.  In some countries, doctors often prescribe topical NSAIDs for relief of chronic conditions such as arthritis and tendinitis. A review of treatment with NSAID creams found that they worked as well as oral medications but without the serious side effects. PREVENTION  Maintain a healthy weight. Extra pounds put more strain on your joints.  Get strong, stay limber. Weak muscles are a common cause of knee injuries. Stretching is important. Include flexibility exercises in your workouts.  Be smart about exercise. If you have osteoarthritis, chronic knee pain or recurring injuries, you may need to change the way you exercise. This does not mean you have to stop being active. If your knees ache after jogging or playing basketball, consider switching to swimming, water aerobics, or other low-impact activities, at least for a few days a week. Sometimes limiting high-impact activities will provide relief.  Make sure your shoes fit well. Choose footwear that is right for your sport.  Protect your knees. Use the proper gear for knee-sensitive activities. Use kneepads when playing  volleyball or laying carpet. Buckle your seat belt every time you drive. Most shattered kneecaps occur in car accidents.  Rest when you are tired. SEEK MEDICAL CARE IF:  You have knee pain that is continual and does not seem to be getting better.  SEEK IMMEDIATE MEDICAL CARE IF:  Your knee joint feels hot to the touch and you have a high fever. MAKE SURE YOU:   Understand these instructions.  Will watch your condition.  Will get help right away if you are not doing well or get worse. Document Released: 03/09/2007 Document Revised: 08/04/2011 Document Reviewed: 03/09/2007 [facility] Patient Information 2015 [employer]. This  information is not intended to replace advice given to you by your health care provider. Make sure you discuss any questions you have with your health care provider.

## 2014-05-25 LAB — TSH: TSH: 3.764 u[IU]/mL (ref 0.350–4.500)

## 2014-05-30 DIAGNOSIS — M25562 Pain in left knee: Principal | ICD-10-CM

## 2014-05-30 DIAGNOSIS — M25561 Pain in right knee: Secondary | ICD-10-CM | POA: Insufficient documentation

## 2014-05-30 NOTE — Assessment & Plan Note (Addendum)
Etiology not clear. Unlikely osteoarthritis at age 39. Possibly overuse. Most likely due to weight gain on top of already having morbid obesity. -mobic 7.5mg  daily as needed for pain (not a long term medication) -ice, elevate knees -exercise as able to help with weight management.  -For weight management: Call Dr. Gerilyn PilgrimSykes (our nutritionist) to set up an appointment. -bilat standing xrays & complete films of each knee -checking thyroid function to eval for weight gain etiology

## 2014-06-06 ENCOUNTER — Encounter: Payer: Self-pay | Admitting: Family Medicine

## 2014-06-06 ENCOUNTER — Telehealth: Payer: Self-pay | Admitting: Family Medicine

## 2014-06-06 NOTE — Telephone Encounter (Signed)
Patient informed of normal TSH.

## 2014-06-06 NOTE — Telephone Encounter (Signed)
Pt called and wanted to k now what her test results were from 2 weeks ago. They were testing her thyroid and she is concerned. jw

## 2014-10-15 IMAGING — RF DG CHOLANGIOGRAM OPERATIVE
1 series · 8 of 8 positions shown · non-contrast
Comparison: 05/10/2013

CLINICAL DATA: Laparoscopic cholecystectomy

EXAM:
INTRAOPERATIVE CHOLANGIOGRAM
TECHNIQUE: Cholangiographic images from the C-arm fluoroscopic device were
submitted for interpretation post-operatively. Please see the
procedural report for the amount of contrast and the fluoroscopy
time utilized.

[Series 1: run · 2 acquisitions, 8 frames shown]
[im 1/2]
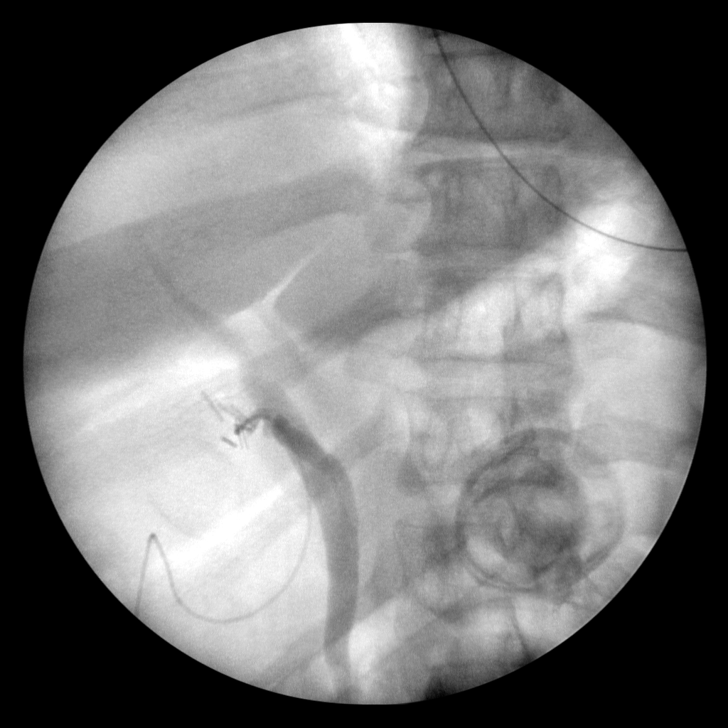
[im 1/2]
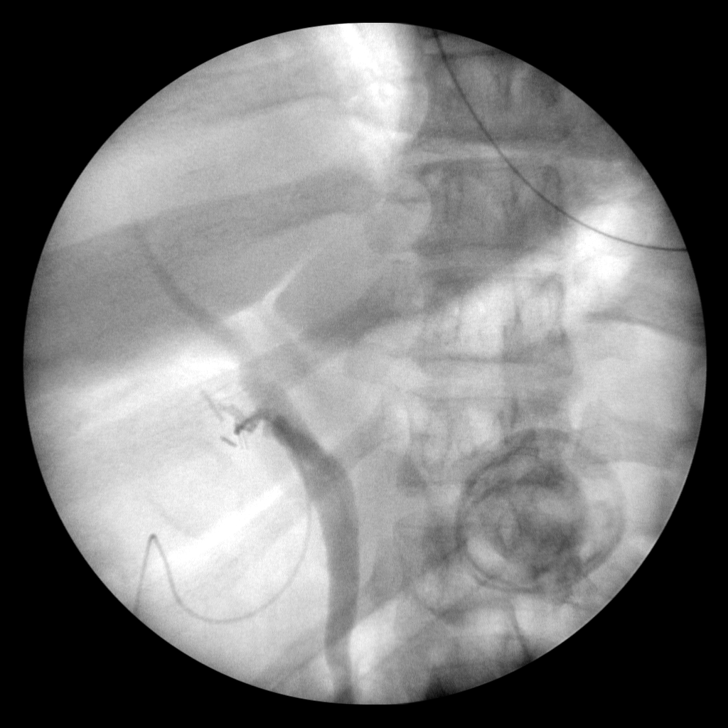
[im 1/2]
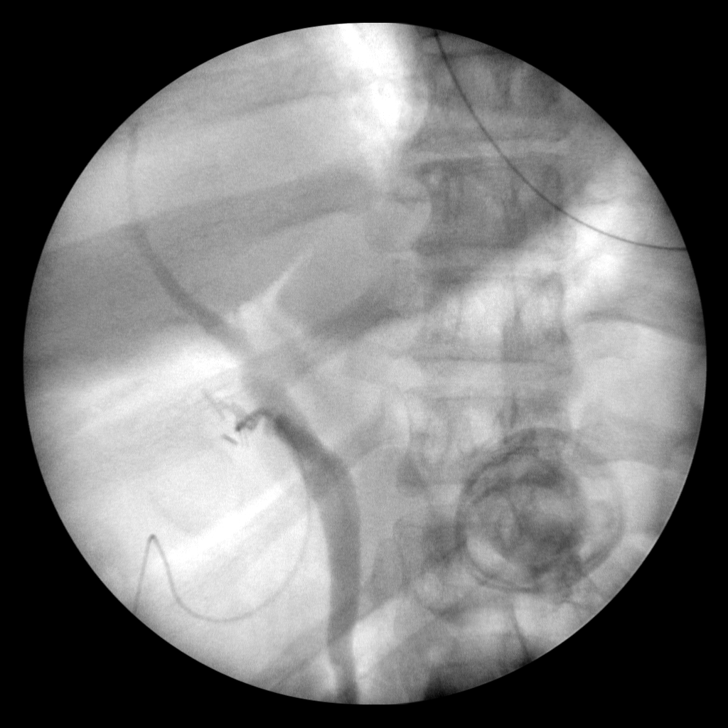
[im 1/2]
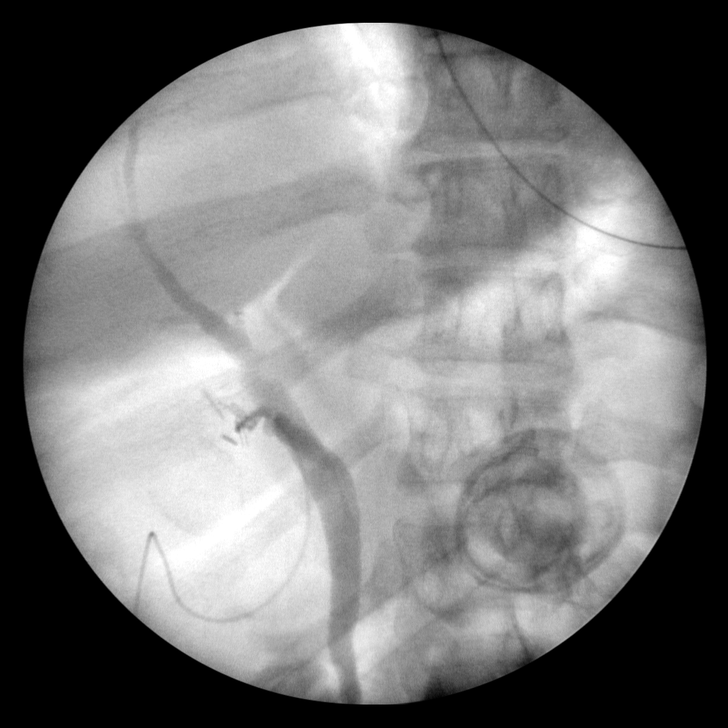
[im 2/2]
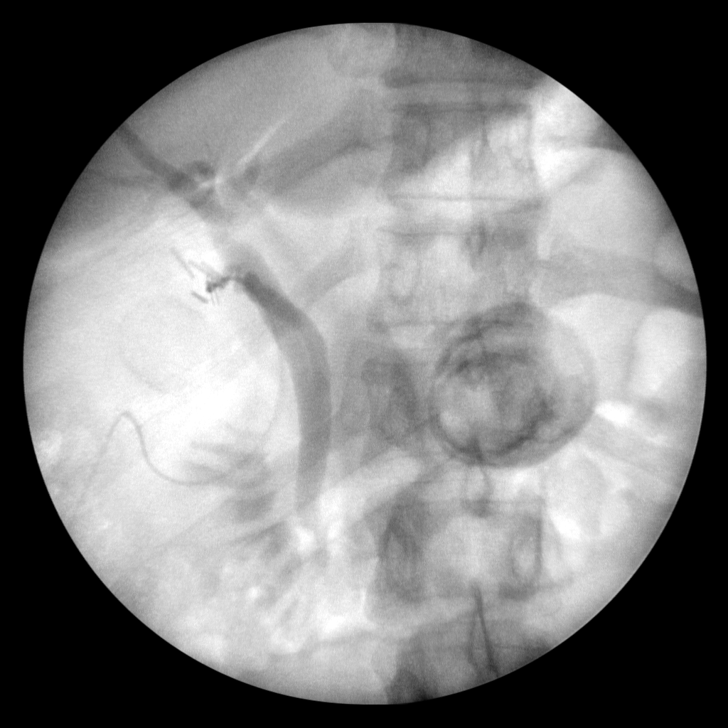
[im 2/2]
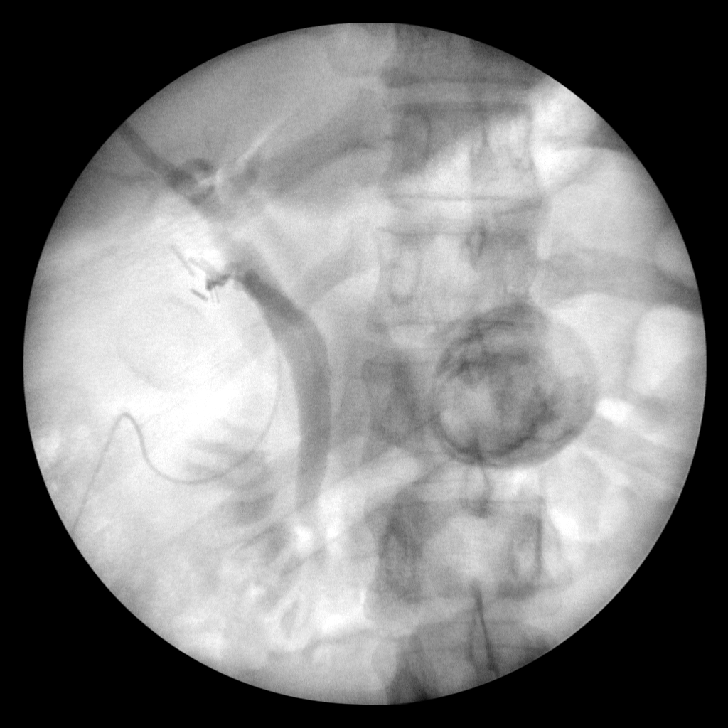
[im 2/2]
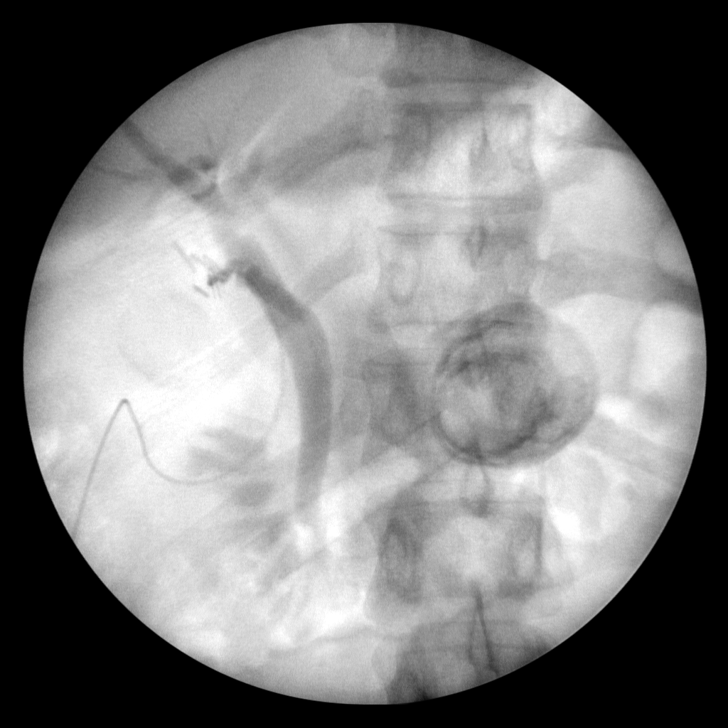
[im 2/2]
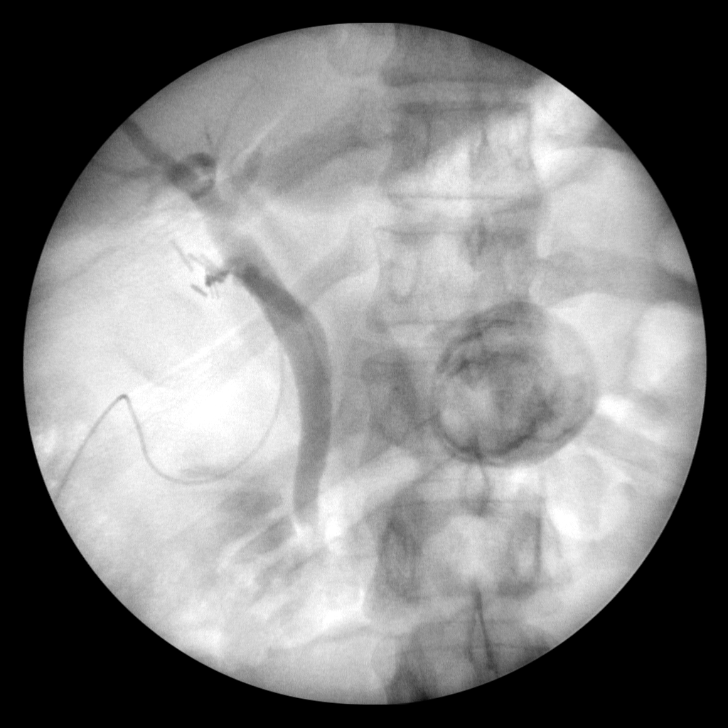

[8 of 8 positions shown; findings below may reference images not displayed]

FINDINGS: Intraoperative cholangiogram performed following the laparoscopic
cholecystectomy. The cystic duct, biliary confluence, common hepatic
duct, and common bile duct appear patent. No definite obstruction,
dilatation, filling defect or retained stone. Contrast opacifies the
duodenum.
IMPRESSION: Patent biliary system.

## 2016-05-30 ENCOUNTER — Emergency Department (HOSPITAL_COMMUNITY)
Admission: EM | Admit: 2016-05-30 | Discharge: 2016-05-30 | Disposition: A | Discharge status: Home / Self Care | Payer: Medicaid Other | Attending: Emergency Medicine | Admitting: Emergency Medicine

## 2016-05-30 ENCOUNTER — Encounter (HOSPITAL_COMMUNITY): Payer: Self-pay

## 2016-05-30 DIAGNOSIS — L292 Pruritus vulvae: Secondary | ICD-10-CM | POA: Insufficient documentation

## 2016-05-30 DIAGNOSIS — R238 Other skin changes: Secondary | ICD-10-CM

## 2016-05-30 LAB — WET PREP, GENITAL
Clue Cells Wet Prep HPF POC: NONE SEEN
SPERM: NONE SEEN
Trich, Wet Prep: NONE SEEN
WBC WET PREP: NONE SEEN
Yeast Wet Prep HPF POC: NONE SEEN

## 2016-05-30 LAB — I-STAT BETA HCG BLOOD, ED (MC, WL, AP ONLY)

## 2016-05-30 NOTE — ED Provider Notes (Signed)
[facility]-EMERGENCY DEPT Provider Note   CSN: [id number] Arrival date & time: 05/30/16  1014  By signing my name below, I, Teofilo Pod, attest that this documentation has been prepared under the direction and in the presence of Gerrell Tabet, [place]. Electronically Signed: Teofilo Pod, ED Scribe. 05/30/2016. 10:38 AM.    History   Chief Complaint Chief Complaint  Patient presents with  . Vaginitis    The history is provided by the patient. No language interpreter was used.   HPI Comments:  Wendy Powers is a 41 y.o. female who presents to the Emergency Department complaining of constant vaginal itching for 2-3 weeks. She initially thought she had a  yeast infection and used a 3 day treatment that has provided no relief. Pt states that her vagina has been wet and itchy constantly. Pt states that she has not had sex in 3 months. LNMP was 1 week ago. Pt is requesting an STD test. Pt denies fever, chills, N/V, abdominal pain, dysuria, urinary frequency or urgency.   Past Medical History:  Diagnosis Date  . Cholelithiasis with cholecystitis 05/20/2013   Lap chole and IOC on 05/20/13   . Hx of tubal ligation   . 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 MRSA (methicillin resistant Staphylococcus aureus) 2010   right leg, right arm pit. left leg  . Pregnancy induced hypertension     Patient Active Problem List   Diagnosis Date Noted  . Bilateral knee pain 05/30/2014  . Vulvar rash 12/20/2013  . Genital warts 12/20/2013  . Pain of right thumb 12/20/2013  . Morbid obesity (HCC) 12/20/2013    Past Surgical History:  Procedure Laterality Date  . CESAREAN SECTION     x 2  . CHOLECYSTECTOMY N/A 05/20/2013   Procedure: LAPAROSCOPIC CHOLECYSTECTOMY WITH INTRAOPERATIVE CHOLANGIOGRAM;  Surgeon: Currie Paris, MD;  Location: [facility] ORS;  Service: General;  Laterality: N/A;  . LAPAROSCOPIC CHOLECYSTECTOMY W/ CHOLANGIOGRAPHY  05/20/13   for acute cholecystitis    . TUBAL LIGATION      OB History    No data available       Home Medications    Prior to Admission medications   Medication Sig Start Date End Date Taking? Authorizing Provider  ibuprofen (ADVIL,MOTRIN) 200 MG tablet Take 400 mg by mouth every 6 (six) hours as needed for headache, mild pain or moderate pain.   Yes Historical Provider, MD    Family History Family History  Problem Relation Age of Onset  . Asthma Brother   . Stroke Maternal Grandmother   . Cancer Maternal Grandmother     lung cancer  . Heart disease Paternal Grandmother     died of heart attack  . Heart disease Paternal Grandfather     died of heart attack  . Hypotension Other     Social History Social History  Substance Use Topics  . Smoking status: Never Smoker  . Smokeless tobacco: Never Used  . Alcohol use Yes     Comment: 1 to 2 drinks on weekend     Allergies   Patient has no known allergies.   Review of Systems Review of Systems  Constitutional: Positive for chills and fever.  Gastrointestinal: Positive for abdominal pain. Negative for nausea and vomiting.  Genitourinary: Positive for vaginal discharge and vaginal pain. Negative for dysuria, frequency and urgency.  Skin: Negative for color change, rash and wound.  Allergic/Immunologic: Negative for immunocompromised state.  Psychiatric/Behavioral: Negative  for self-injury.  All other systems reviewed and are negative.    Physical Exam Updated Vital Signs BP 127/83 (BP Location: Right Arm)   Pulse 70   Temp 98 F (36.7 C) (Oral)   Resp 18   LMP 05/23/2016   SpO2 98%   Physical Exam  Constitutional: She appears well-developed and well-nourished. No distress.  HENT:  Head: Normocephalic and atraumatic.  Neck: Neck supple.  Pulmonary/Chest: Effort normal.  Abdominal: Soft. She exhibits no distension and no mass. There is no tenderness. There is no rebound and no guarding.  Genitourinary: Uterus is not tender. Cervix exhibits  no motion tenderness. Right adnexum displays no mass, no tenderness and no fullness. Left adnexum displays no mass, no tenderness and no fullness. There is tenderness in the vagina. No erythema in the vagina. No foreign body in the vagina. No signs of injury around the vagina. No vaginal discharge found.  Genitourinary Comments: Small amount of blood and mucus coming through cervical os.  No other vaginal discharge noted.  Vagina mildly tender throughout.  No cervical motion tenderness.   Neurological: She is alert.  Skin: She is not diaphoretic.  Nursing note and vitals reviewed.    ED Treatments / Results  DIAGNOSTIC STUDIES:  Oxygen Saturation is 99% on RA, normal by my interpretation.    COORDINATION OF CARE:  10:38 AM Discussed treatment plan with pt at bedside and pt agreed to plan.   Labs (all labs ordered are listed, but only abnormal results are displayed) Labs Reviewed  WET PREP, GENITAL  RPR  HIV ANTIBODY (ROUTINE TESTING)  I-STAT BETA HCG BLOOD, ED ([facility], [facility], [facility] ONLY)  GC/CHLAMYDIA PROBE AMP ([place]) NOT AT [facility]    EKG  EKG Interpretation None       Radiology No results found.  Procedures Procedures (including critical care time)  Medications Ordered in ED Medications - No data to display   Initial Impression / Assessment and Plan / ED Course  I have reviewed the triage vital signs and the nursing notes.  Pertinent labs & imaging results that were available during my care of the patient were reviewed by me and considered in my medical decision making (see chart for details).  Clinical Course    Afebrile, nontoxic patient with vaginal itching and discharge x weeks.  Wet prep negative.  Pt reports symptoms are actually mostly external and she has sensitive skin, thinks it may be more likely related to soaps, detergents, etc.  We had a long discussion regarding this and I gave her recommendations for home care.  D/C home.  Discussed result, findings,  treatment, and follow up  with patient.  Pt given return precautions.  Pt verbalizes understanding and agrees with plan.       Final Clinical Impressions(s) / ED Diagnoses   Final diagnoses:  Skin irritation    New Prescriptions Discharge Medication List as of 05/30/2016 12:04 PM      I personally performed the services described in this documentation, which was scribed in my presence. The recorded information has been reviewed and is accurate.     Trixie Dredgemily Geena Weinhold, PA-C 05/30/16 1449    Lorre NickAnthony Allen, MD 05/31/16 1145

## 2016-05-30 NOTE — ED Triage Notes (Signed)
Pt dx with yeast infection week before christmas.  Pt states symptoms not resolving.  Pt with vaginal itching.  Very minimal odor.  Some dampness noted

## 2016-05-30 NOTE — Discharge Instructions (Signed)
Read the information below.  You may return to the Emergency Department at any time for worsening condition or any new symptoms that concern you. °

## 2016-05-31 LAB — HIV ANTIBODY (ROUTINE TESTING W REFLEX): HIV Screen 4th Generation wRfx: NONREACTIVE

## 2016-05-31 LAB — RPR: RPR Ser Ql: NONREACTIVE

## 2016-06-02 LAB — GC/CHLAMYDIA PROBE AMP (~~LOC~~) NOT AT ARMC
Chlamydia: NEGATIVE
Neisseria Gonorrhea: NEGATIVE
